# Patient Record
Sex: Male | Born: 1980 | Race: White | Hispanic: No | Marital: Married | Smoking: Current every day smoker
Health system: Southern US, Community
[De-identification: ages and names within clinical notes are randomized; demographics above are authoritative.]

## PROBLEM LIST (undated history)

## (undated) DIAGNOSIS — G4730 Sleep apnea, unspecified: Secondary | ICD-10-CM

## (undated) DIAGNOSIS — K219 Gastro-esophageal reflux disease without esophagitis: Secondary | ICD-10-CM

## (undated) DIAGNOSIS — F1011 Alcohol abuse, in remission: Secondary | ICD-10-CM

## (undated) DIAGNOSIS — F4310 Post-traumatic stress disorder, unspecified: Secondary | ICD-10-CM

## (undated) DIAGNOSIS — R030 Elevated blood-pressure reading, without diagnosis of hypertension: Secondary | ICD-10-CM

## (undated) DIAGNOSIS — Z9109 Other allergy status, other than to drugs and biological substances: Secondary | ICD-10-CM

## (undated) DIAGNOSIS — F419 Anxiety disorder, unspecified: Secondary | ICD-10-CM

## (undated) HISTORY — DX: Anxiety disorder, unspecified: F41.9

## (undated) HISTORY — DX: Alcohol abuse, in remission: F10.11

## (undated) HISTORY — DX: Sleep apnea, unspecified: G47.30

## (undated) HISTORY — PX: ELBOW SURGERY: SHX618

## (undated) HISTORY — DX: Other allergy status, other than to drugs and biological substances: Z91.09

## (undated) HISTORY — DX: Elevated blood-pressure reading, without diagnosis of hypertension: R03.0

## (undated) HISTORY — PX: FRACTURE SURGERY: SHX138

## (undated) HISTORY — DX: Post-traumatic stress disorder, unspecified: F43.10

## (undated) HISTORY — PX: ANKLE SURGERY: SHX546

---

## 1998-02-04 ENCOUNTER — Emergency Department (HOSPITAL_COMMUNITY): Admission: EM | Admit: 1998-02-04 | Discharge: 1998-02-04 | Payer: Self-pay | Admitting: Emergency Medicine

## 1998-02-08 ENCOUNTER — Encounter: Admission: RE | Admit: 1998-02-08 | Discharge: 1998-05-09 | Payer: Self-pay | Admitting: *Deleted

## 2003-03-08 ENCOUNTER — Emergency Department (HOSPITAL_COMMUNITY): Admission: EM | Admit: 2003-03-08 | Discharge: 2003-03-08 | Payer: Self-pay | Admitting: Emergency Medicine

## 2003-03-09 ENCOUNTER — Emergency Department (HOSPITAL_COMMUNITY): Admission: EM | Admit: 2003-03-09 | Discharge: 2003-03-09 | Payer: Self-pay | Admitting: Emergency Medicine

## 2006-10-11 ENCOUNTER — Emergency Department (HOSPITAL_COMMUNITY): Admission: EM | Admit: 2006-10-11 | Discharge: 2006-10-11 | Payer: Self-pay | Admitting: Emergency Medicine

## 2009-03-14 ENCOUNTER — Emergency Department (HOSPITAL_COMMUNITY): Admission: EM | Admit: 2009-03-14 | Discharge: 2009-03-14 | Payer: Self-pay | Admitting: Emergency Medicine

## 2013-05-26 ENCOUNTER — Emergency Department: Payer: Self-pay | Admitting: Emergency Medicine

## 2015-04-15 ENCOUNTER — Ambulatory Visit (INDEPENDENT_AMBULATORY_CARE_PROVIDER_SITE_OTHER): Payer: BLUE CROSS/BLUE SHIELD | Admitting: Physician Assistant

## 2015-04-15 VITALS — BP 114/78 | HR 92 | Temp 98.6°F | Resp 17 | Ht 69.0 in | Wt 244.0 lb

## 2015-04-15 DIAGNOSIS — H6593 Unspecified nonsuppurative otitis media, bilateral: Secondary | ICD-10-CM

## 2015-04-15 DIAGNOSIS — J0100 Acute maxillary sinusitis, unspecified: Secondary | ICD-10-CM | POA: Diagnosis not present

## 2015-04-15 MED ORDER — AMOXICILLIN-POT CLAVULANATE 875-125 MG PO TABS: 1.0000 | ORAL_TABLET | Freq: Two times a day (BID) | ORAL | Status: AC

## 2015-04-15 MED ORDER — IPRATROPIUM BROMIDE 0.03 % NA SOLN: 2.0000 | Freq: Two times a day (BID) | NASAL | Status: DC

## 2015-04-15 MED ORDER — GUAIFENESIN ER 1200 MG PO TB12: 1.0000 | ORAL_TABLET | Freq: Two times a day (BID) | ORAL | Status: DC | PRN

## 2015-04-15 NOTE — Progress Notes (Signed)
Subjective:   Patient ID: Terrence Terry, male     DOB: 10/09/1980, 35 y.o.    MRN: [id number]  PCP: No PCP Per Patient  Chief Complaint  Patient presents with  . Sinus Congestion    X 2 days  . Otalgia    Bilateral, X 2 days    HPI  Presents for evaluation of sinus congestion and b/l otatlgia x 2 days.   2.5 pack year history, current smoker. Reports sick contact with 37 month old daughter who has b/l ear infections. First noticed symptoms last Friday, 04/08/15, when nose started to become "stuffy". Over the last two days the congestion has worsened, and has been expereiencing b/l otolgia. Sinus pressure in maxilla region. Otalgia described as "pressure and achy". The ear pain last night started radiating down to his left jaw, and into his lymph nodes last night. Symptoms worsened when sitting still or trying to sleep. Associated rhinorrhea and PND. No relief with 2 tylenol pm on 2 occasion other than allowing him to fall asleep. Denies fever, chills, fatigue, dental problem, ear d/c, hearing loss, sore throat, or cough. .    Prior to Admission medications   Not on File     No Known Allergies   There are no active problems to display for this patient.    Family History  Problem Relation Age of Onset  . Cancer Father     Lung, Bone     Social History   Social History  . Marital Status: Single    Spouse Name: N/A  . Number of Children: N/A  . Years of Education: N/A   Occupational History  . Not on file.   Social History Main Topics  . Smoking status: Current Every Day Smoker  . Smokeless tobacco: Not on file  . Alcohol Use: 0.0 oz/week    0 Standard drinks or equivalent per week  . Drug Use: No  . Sexual Activity: Not on file   Other Topics Concern  . Not on file   Social History Narrative  . No narrative on file        Review of Systems Constitutional: Negative for fever, chills and fatigue.  HENT: Positive for congestion, postnasal drip,  rhinorrhea and sinus pressure. Negative for dental problem, ear discharge, ear pain, hearing loss, nosebleeds, sore throat and trouble swallowing.  Eyes: Negative for pain, itching and visual disturbance.  Respiratory: Negative for cough, chest tightness and shortness of breath.  Cardiovascular: Negative for chest pain.  Gastrointestinal: Negative for nausea and abdominal pain.  Musculoskeletal: Positive for myalgias and neck stiffness (attributes to throwing boxes at work).       Objective:  Physical Exam  Constitutional: [name] oriented to person, place, and time. Vital signs are normal. He appears well-developed and well-nourished. No distress.  BP 114/78 mmHg  Pulse 92  Temp(Src) 98.6 F (37 C) (Oral)  Resp 17  Ht  (1.753 m)  Wt 244 lb (110.678 kg)  BMI 36.02 kg/m2  SpO2 98%   HENT:  Head: Normocephalic and atraumatic.  Right Ear: Hearing, external ear and ear canal normal. No drainage or tenderness. Tympanic membrane is retracted. Tympanic membrane is not injected, not perforated, not erythematous and not bulging. A middle ear effusion is present. No hemotympanum.  Left Ear: Hearing, external ear and ear canal normal. No drainage or tenderness. Tympanic membrane is retracted. Tympanic membrane is not injected, not scarred, not perforated and not erythematous. A middle ear effusion is present.  No hemotympanum.  Nose: Mucosal edema and rhinorrhea present.  No foreign bodies. Right sinus exhibits no maxillary sinus tenderness and no frontal sinus tenderness. Left sinus exhibits no maxillary sinus tenderness and no frontal sinus tenderness.  Mouth/Throat: Uvula is midline, oropharynx is clear and moist and mucous membranes are normal. No uvula swelling. No oropharyngeal exudate.  Eyes: Conjunctivae and EOM are normal. Pupils are equal, round, and reactive to light. Right eye exhibits no discharge. Left eye exhibits no discharge. No scleral icterus.  Neck: Trachea normal, normal  range of motion and full passive range of motion without pain. Neck supple. No thyroid mass and no thyromegaly present.  Cardiovascular: Normal rate, regular rhythm and normal heart sounds.   Pulmonary/Chest: Effort normal and breath sounds normal.  Lymphadenopathy:       Head (right side): No submandibular, no tonsillar, no preauricular, no posterior auricular and no occipital adenopathy present.       Head (left side): No submandibular, no tonsillar, no preauricular and no occipital adenopathy present.    He has no cervical adenopathy.       Right: No supraclavicular adenopathy present.       Left: No supraclavicular adenopathy present.  Neurological: [name] alert and oriented to person, place, and time. He has normal strength. No cranial nerve deficit or sensory deficit.  Skin: Skin is warm, dry and intact. No rash noted.  Psychiatric: He has a normal mood and affect. His speech is normal and behavior is normal.             Assessment & Plan:  1. Acute maxillary sinusitis, recurrence not specified 2. Otitis media with effusion, bilateral High risk of developing AOM. Supportive care. Anticipatory guidance. Encouraged smoking cessation. - ipratropium (ATROVENT) 0.03 % nasal spray; Place 2 sprays into both nostrils 2 (two) times daily.  Dispense: 30 mL; Refill: 0 - Guaifenesin (MUCINEX MAXIMUM STRENGTH) 1200 MG TB12; Take 1 tablet (1,200 mg total) by mouth every 12 (twelve) hours as needed.  Dispense: 14 tablet; Refill: 1 - amoxicillin-clavulanate (AUGMENTIN) 875-125 MG tablet; Take 1 tablet by mouth 2 (two) times daily.  Dispense: 20 tablet; Refill: 0    Fernande Bras, PA-C Physician Assistant-Certified [facility]

## 2015-04-15 NOTE — Progress Notes (Signed)
Subjective:    Patient ID: Terrence Terry, male    DOB: Sep 12, 1980, 35 y.o.   MRN: [id number]  Chief Complaint  Patient presents with  . Sinus Congestion    X 2 days  . Otalgia    Bilateral, X 2 days    HPI Presents today for sinus congestion and b/l otatlgia x 2 days. 2.5 pack year history, current smoker. Reports sick contact with 74 month old daughter who has b/l ear infections. First noticed symptoms last Friday, 04/08/15, when nose started to become "stuffy". Over the last two days the congestion has worsened, and has been expereiencing b/l otolgia. Sinus pressure in maxilla region. Otalgia described as "pressure and achy". The ear pain last night started radiating down to his left jaw, and into his lymph nodes last night. Symptoms worsened when sitting still or trying to sleep. Associated rhinorrhea and PND. No relief with 2 tylenol pm on 2 occasion other than allowing him to fall asleep. Denies fever, chills, fatigue, dental problem, ear d/c, hearing loss, sore throat, or cough.    Review of Systems  Constitutional: Negative for fever, chills and fatigue.  HENT: Positive for congestion, postnasal drip, rhinorrhea and sinus pressure. Negative for dental problem, ear discharge, ear pain, hearing loss, nosebleeds, sore throat and trouble swallowing.   Eyes: Negative for pain, itching and visual disturbance.  Respiratory: Negative for cough, chest tightness and shortness of breath.   Cardiovascular: Negative for chest pain.  Gastrointestinal: Negative for nausea and abdominal pain.  Musculoskeletal: Positive for myalgias and neck stiffness (attributes to throwing boxes at work).   No Known Allergies  No current home medications  There are no active problems to display for this patient.     Objective:   Physical Exam  Constitutional: Vital signs are normal. He appears well-developed and well-nourished. No distress.  BP 114/78 mmHg  Pulse 92  Temp(Src) 98.6 F (37 C) (Oral)   Resp 17  Ht  (1.753 m)  Wt 244 lb (110.678 kg)  BMI 36.02 kg/m2  SpO2 98%   HENT:  Head: Normocephalic and atraumatic.  Right Ear: Hearing normal. No lacerations. There is swelling. No drainage or tenderness. No foreign bodies. No mastoid tenderness. Tympanic membrane is retracted. Tympanic membrane is not injected, not scarred, not perforated, not erythematous and not bulging. Tympanic membrane mobility is normal. A middle ear effusion is present. No decreased hearing is noted.  Left Ear: Hearing normal. No lacerations. There is swelling. No drainage or tenderness. No foreign bodies. No mastoid tenderness. Tympanic membrane is retracted. Tympanic membrane is not injected, not scarred, not perforated, not erythematous and not bulging. Tympanic membrane mobility is normal. A middle ear effusion is present. No hemotympanum. No decreased hearing is noted.  Nose: Rhinorrhea present. Right sinus exhibits maxillary sinus tenderness. Right sinus exhibits no frontal sinus tenderness. Left sinus exhibits maxillary sinus tenderness. Left sinus exhibits no frontal sinus tenderness.  Mouth/Throat: Uvula is midline and oropharynx is clear and moist. No oropharyngeal exudate.     Eyes: Conjunctivae, EOM and lids are normal. Pupils are equal, round, and reactive to light.  Neck: Trachea normal. Neck supple.  Cardiovascular: Normal rate, regular rhythm and normal heart sounds.  Exam reveals no gallop and no friction rub.   No murmur heard. Pulmonary/Chest: Effort normal and breath sounds normal. He has no wheezes.  Musculoskeletal:       Cervical back: He exhibits tenderness. He exhibits normal range of motion.  Lymphadenopathy:  Head (right side): No submental, no submandibular, no tonsillar, no preauricular, no posterior auricular and no occipital adenopathy present.       Head (left side): Submental and submandibular adenopathy present. No tonsillar, no preauricular, no posterior auricular and no  occipital adenopathy present.    He has no cervical adenopathy.  Neurological: [name] alert.  Skin: Skin is warm and dry. No rash noted.  Psychiatric: He has a normal mood and affect. His behavior is normal.  Vitals reviewed.     Assessment & Plan:  1. Acute maxillary sinusitis, recurrence not specified 2. Otitis media with effusion, bilateral - ipratropium (ATROVENT) 0.03 % nasal spray; Place 2 sprays into both nostrils 2 (two) times daily.  Dispense: 30 mL; Refill: 0 - Guaifenesin (MUCINEX MAXIMUM STRENGTH) 1200 MG TB12; Take 1 tablet (1,200 mg total) by mouth every 12 (twelve) hours as needed.  Dispense: 14 tablet; Refill: 1 - amoxicillin-clavulanate (AUGMENTIN) 875-125 MG tablet; Take 1 tablet by mouth 2 (two) times daily.  Dispense: 20 tablet; Refill: 0  -Patient instructed to drink plenty of fluids.   Return if symptoms worsen or fail to improve.

## 2015-04-15 NOTE — Patient Instructions (Signed)
Get plenty of rest and drink at least 64 ounces of water daily. 

## 2015-12-16 ENCOUNTER — Ambulatory Visit (INDEPENDENT_AMBULATORY_CARE_PROVIDER_SITE_OTHER): Payer: BLUE CROSS/BLUE SHIELD | Admitting: Family Medicine

## 2015-12-16 ENCOUNTER — Ambulatory Visit (INDEPENDENT_AMBULATORY_CARE_PROVIDER_SITE_OTHER): Payer: BLUE CROSS/BLUE SHIELD

## 2015-12-16 ENCOUNTER — Encounter: Payer: Self-pay | Admitting: Family Medicine

## 2015-12-16 VITALS — BP 122/80 | HR 90 | Temp 98.7°F | Resp 18 | Ht 69.0 in | Wt 257.0 lb

## 2015-12-16 DIAGNOSIS — M25522 Pain in left elbow: Secondary | ICD-10-CM

## 2015-12-16 DIAGNOSIS — Z23 Encounter for immunization: Secondary | ICD-10-CM | POA: Diagnosis not present

## 2015-12-16 MED ORDER — NAPROXEN 500 MG PO TABS: 500.0000 mg | ORAL_TABLET | Freq: Two times a day (BID) | ORAL | 0 refills | Status: DC

## 2015-12-16 NOTE — Patient Instructions (Addendum)
Start naproxen 500 mg twice daily with food for a minimal of 14 days. May continue longer if needed. Referral to orthopedic and they will contact you to schedule your appointment.   IF you received an x-ray today, you will [facility]. Please contact [facility] at [phone] with questions or concerns regarding your invoice.   IF you received labwork today, you will receive an invoice from [facility]. Please contact [facility] at [phone] with questions or concerns regarding your invoice.   Our billing staff will not be able to assist you with questions regarding bills from these companies.  You will be contacted with the lab results as soon as they are available. The fastest way to get your results is to activate your My Chart account. Instructions are located on the last page of this paperwork. If you have not heard from us regarding the results in 2 weeks, please contact this office.     Lateral Epicondylitis With Rehab Lateral epicondylitis involves inflammation and pain around the outer portion of the elbow. The pain is caused by inflammation of the tendons in the forearm that bring back (extend) the wrist. Lateral epicondylitis is also called tennis elbow, because it is very common in tennis players. However, it may occur in any individual who extends the wrist repetitively. If lateral epicondylitis is left untreated, it may become a chronic problem. SYMPTOMS   Pain, tenderness, and inflammation on the outer (lateral) side of the elbow.  Pain or weakness with gripping activities.  Pain that increases with wrist-twisting motions (playing tennis, using a screwdriver, opening a door or a jar).  Pain with lifting objects, including a coffee cup. CAUSES  Lateral epicondylitis is caused by inflammation of the tendons that extend the wrist. Causes of injury may include:  Repetitive stress and strain on the muscles  and tendons that extend the wrist.  Sudden change in activity level or intensity.  Incorrect grip in racquet sports.  Incorrect grip size of racquet (often too large).  Incorrect hitting position or technique (usually backhand, leading with the elbow).  Using a racket that is too heavy. RISK INCREASES WITH:  Sports or occupations that require repetitive and/or strenuous forearm and wrist movements (tennis, squash, racquetball, carpentry).  Poor wrist and forearm strength and flexibility.  Failure to warm up properly before activity.  Resuming activity before healing, rehabilitation, and conditioning are complete. PREVENTION   Warm up and stretch properly before activity.  Maintain physical fitness:  Strength, flexibility, and endurance.  Cardiovascular fitness.  Wear and use properly fitted equipment.  Learn and use proper technique and have a coach correct improper technique.  Wear a tennis elbow (counterforce) brace. PROGNOSIS  The course of this condition depends on the degree of the injury. If treated properly, acute cases (symptoms lasting less than 4 weeks) are often resolved in 2 to 6 weeks. Chronic (longer lasting cases) often resolve in 3 to 6 months but may require physical therapy. RELATED COMPLICATIONS   Frequently recurring symptoms, resulting in a chronic problem. Properly treating the problem the first time decreases frequency of recurrence.  Chronic inflammation, scarring tendon degeneration, and partial tendon tear, requiring surgery.  Delayed healing or resolution of symptoms. TREATMENT  Treatment first involves the use of ice and medicine to reduce pain and inflammation. Strengthening and stretching exercises may help reduce discomfort if performed regularly. These exercises may be performed at home if the condition is an acute injury. Chronic cases may require  a referral to a physical therapist for evaluation and treatment. Your caregiver may advise a  corticosteroid injection to help reduce inflammation. Rarely, surgery is needed. MEDICATION  If pain medicine is needed, nonsteroidal anti-inflammatory medicines (aspirin and ibuprofen), or other minor pain relievers (acetaminophen), are often advised.  Do not take pain medicine for 7 days before surgery.  Prescription pain relievers may be given, if your caregiver thinks they are needed. Use only as directed and only as much as you need.  Corticosteroid injections may be recommended. These injections should be reserved only for the most severe cases, because they can only be given a certain number of times. HEAT AND COLD  Cold treatment (icing) should be applied for 10 to 15 minutes every 2 to 3 hours for inflammation and pain, and immediately after activity that aggravates your symptoms. Use ice packs or an ice massage.  Heat treatment may be used before performing stretching and strengthening activities prescribed by your caregiver, physical therapist, or athletic trainer. Use a heat pack or a warm water soak. SEEK MEDICAL CARE IF: Symptoms get worse or do not improve in 2 weeks, despite treatment. EXERCISES  RANGE OF MOTION (ROM) AND STRETCHING EXERCISES - Epicondylitis, Lateral (Tennis Elbow) These exercises may help you when beginning to rehabilitate your injury. Your symptoms may go away with or without further involvement from your physician, physical therapist, or athletic trainer. While completing these exercises, remember:   Restoring tissue flexibility helps normal motion to return to the joints. This allows healthier, less painful movement and activity.  An effective stretch should be held for at least 30 seconds.  A stretch should never be painful. You should only feel a gentle lengthening or release in the stretched tissue. RANGE OF MOTION - Wrist Flexion, Active-Assisted  Extend your right / left elbow with your fingers pointing down.*  Gently pull the back of your hand  towards you, until you feel a gentle stretch on the top of your forearm.  Hold this position for __________ seconds. Repeat __________ times. Complete this exercise __________ times per day.  *If directed by your physician, physical therapist or athletic trainer, complete this stretch with your elbow bent, rather than extended. RANGE OF MOTION - Wrist Extension, Active-Assisted  Extend your right / left elbow and turn your palm upwards.*  Gently pull your palm and fingertips back, so your wrist extends and your fingers point more toward the ground.  You should feel a gentle stretch on the inside of your forearm.  Hold this position for __________ seconds. Repeat __________ times. Complete this exercise __________ times per day. *If directed by your physician, physical therapist or athletic trainer, complete this stretch with your elbow bent, rather than extended. STRETCH - Wrist Flexion  Place the back of your right / left hand on a tabletop, leaving your elbow slightly bent. Your fingers should point away from your body.  Gently press the back of your hand down onto the table by straightening your elbow. You should feel a stretch on the top of your forearm.  Hold this position for __________ seconds. Repeat __________ times. Complete this stretch __________ times per day.  STRETCH - Wrist Extension   Place your right / left fingertips on a tabletop, leaving your elbow slightly bent. Your fingers should point backwards.  Gently press your fingers and palm down onto the table by straightening your elbow. You should feel a stretch on the inside of your forearm.  Hold this position for __________ seconds.  Repeat __________ times. Complete this stretch __________ times per day.  STRENGTHENING EXERCISES - Epicondylitis, Lateral (Tennis Elbow) These exercises may help you when beginning to rehabilitate your injury. They may resolve your symptoms with or without further involvement from  your physician, physical therapist, or athletic trainer. While completing these exercises, remember:   Muscles can gain both the endurance and the strength needed for everyday activities through controlled exercises.  Complete these exercises as instructed by your physician, physical therapist or athletic trainer. Increase the resistance and repetitions only as guided.  You may experience muscle soreness or fatigue, but the pain or discomfort you are trying to eliminate should never worsen during these exercises. If this pain does get worse, stop and make sure you are following the directions exactly. If the pain is still present after adjustments, discontinue the exercise until you can discuss the trouble with your caregiver. STRENGTH - Wrist Flexors  Sit with your right / left forearm palm-up and fully supported on a table or countertop. Your elbow should be resting below the height of your shoulder. Allow your wrist to extend over the edge of the surface.  Loosely holding a __________ weight, or a piece of rubber exercise band or tubing, slowly curl your hand up toward your forearm.  Hold this position for __________ seconds. Slowly lower the wrist back to the starting position in a controlled manner. Repeat __________ times. Complete this exercise __________ times per day.  STRENGTH - Wrist Extensors  Sit with your right / left forearm palm-down and fully supported on a table or countertop. Your elbow should be resting below the height of your shoulder. Allow your wrist to extend over the edge of the surface.  Loosely holding a __________ weight, or a piece of rubber exercise band or tubing, slowly curl your hand up toward your forearm.  Hold this position for __________ seconds. Slowly lower the wrist back to the starting position in a controlled manner. Repeat __________ times. Complete this exercise __________ times per day.  STRENGTH - Ulnar Deviators  Stand with a  ____________________ weight in your right / left hand, or sit while holding a rubber exercise band or tubing, with your healthy arm supported on a table or countertop.  Move your wrist, so that your pinkie travels toward your forearm and your thumb moves away from your forearm.  Hold this position for __________ seconds and then slowly lower the wrist back to the starting position. Repeat __________ times. Complete this exercise __________ times per day STRENGTH - Radial Deviators  Stand with a ____________________ weight in your right / left hand, or sit while holding a rubber exercise band or tubing, with your injured arm supported on a table or countertop.  Raise your hand upward in front of you or pull up on the rubber tubing.  Hold this position for __________ seconds and then slowly lower the wrist back to the starting position. Repeat __________ times. Complete this exercise __________ times per day. STRENGTH - Forearm Supinators   Sit with your right / left forearm supported on a table, keeping your elbow below shoulder height. Rest your hand over the edge, palm down.  Gently grip a hammer or a soup ladle.  Without moving your elbow, slowly turn your palm and hand upward to a "thumbs-up" position.  Hold this position for __________ seconds. Slowly return to the starting position. Repeat __________ times. Complete this exercise __________ times per day.  STRENGTH - Forearm Pronators   Sit with  your right / left forearm supported on a table, keeping your elbow below shoulder height. Rest your hand over the edge, palm up.  Gently grip a hammer or a soup ladle.  Without moving your elbow, slowly turn your palm and hand upward to a "thumbs-up" position.  Hold this position for __________ seconds. Slowly return to the starting position. Repeat __________ times. Complete this exercise __________ times per day.  STRENGTH - Grip  Grasp a tennis ball, a dense sponge, or a large,  rolled sock in your hand.  Squeeze as hard as you can, without increasing any pain.  Hold this position for __________ seconds. Release your grip slowly. Repeat __________ times. Complete this exercise __________ times per day.  STRENGTH - Elbow Extensors, Isometric  Stand or sit upright, on a firm surface. Place your right / left arm so that your palm faces your stomach, and it is at the height of your waist.  Place your opposite hand on the underside of your forearm. Gently push up as your right / left arm resists. Push as hard as you can with both arms, without causing any pain or movement at your right / left elbow. Hold this stationary position for __________ seconds. Gradually release the tension in both arms. Allow your muscles to relax completely before repeating.   This information is not intended to replace advice given to you by your health care provider. Make sure you discuss any questions you have with your health care provider.   Document Released: 03/05/2005 Document Revised: 03/26/2014 Document Reviewed: 06/17/2008 [employer] Interactive Patient Education [employer]

## 2015-12-16 NOTE — Progress Notes (Signed)
Patient ID: Terrence Terry, male    DOB: December 27, 1980, 35 y.o.   MRN: [id number]  PCP: No PCP Per Patient  Chief Complaint  Patient presents with  . Joint Swelling    left elbow pain  . Flu Vaccine    Subjective:   HPI 35 year old presents for evaluation of left elbow pain times 1.5 weeks. He reports that he works at [employer] and performs repetitive arm/elbow motions for 6-8 hours per day. He reports pain increases substantially during his work day.  He denies injury or trauma but reports a swollen spongy mass on the lateral elbow which has been present for 1 year. Patient has taken tylenol pm for pain with relief.  Social History   Social History  . Marital status: Single    Spouse name: N/A  . Number of children: N/A  . Years of education: N/A   Occupational History  . Not on file.   Social History Main Topics  . Smoking status: Current Every Day Smoker  . Smokeless tobacco: Never Used  . Alcohol use 0.0 oz/week  . Drug use: No  . Sexual activity: Not on file   Other Topics Concern  . Not on file   Social History Narrative  . No narrative on file   Family History  Problem Relation Age of Onset  . Cancer Father     Lung, Bone   Review of Systems See HPI  There are no active problems to display for this patient.  Prior to Admission medications   Not on File    No Known Allergies     Objective:  Physical Exam  Constitutional: [name] oriented to person, place, and time. He appears well-developed and well-nourished.  HENT:  Head: Normocephalic and atraumatic.  Right Ear: External ear normal.  Left Ear: External ear normal.  Nose: Nose normal.  Mouth/Throat: Oropharynx is clear and moist.  Eyes: Conjunctivae are normal. Pupils are equal, round, and reactive to light.  Neck: Normal range of motion.  Cardiovascular: Normal rate, regular rhythm, normal heart sounds and intact distal pulses.   Pulmonary/Chest: Effort normal and breath sounds  normal.  Musculoskeletal:       Arms: Left lateral elbow tenderness with hands gripping, flexion, and rotating motion movements.   Neurological: [name] alert and oriented to person, place, and time.  Skin: Skin is warm and dry.  Psychiatric: He has a normal mood and affect. His behavior is normal. Judgment and thought content normal.     Dg Elbow Complete Left (3+view)  Result Date: 12/16/2015 CLINICAL DATA:  Left elbow pain, possible effusion. EXAM: LEFT ELBOW - COMPLETE 3+ VIEW COMPARISON:  None. FINDINGS: Mild spurring of the coronoid process. No elbow joint effusion. No acute bony findings. Low-level subcutaneous edema dorsal to the olecranon. IMPRESSION: 1. No fracture or joint effusion identified. 2. Low-level subcutaneous edema posterior to the elbow. Electronically Signed   By: Gaylyn RongWalter  Liebkemann M.D.   On: 12/16/2015 08:51   Assessment & Plan:  1. Left elbow pain - DG ELBOW COMPLETE LEFT (3+VIEW); Future - AMB referral to orthopedics Plan:  Start naproxen 500 mg twice daily with food for minimum of 14 days. Longer if needed. Mild bone spurring present coronoid process, possibly related to arthritic changes.  Referring to orthopedics for further evaluation.  2. Need for prophylactic vaccination and inoculation against influenza - Flu Vaccine QUAD 36+ mos IM  Godfrey PickKimberly S. Tiburcio PeaHarris, MSN, FNP-C [facility]

## 2017-10-09 IMAGING — DX DG ELBOW COMPLETE 3+V*L*
4 series · 4 of 4 positions shown · non-contrast
Comparison: None.

CLINICAL DATA: Left elbow pain, possible effusion.

EXAM:
LEFT ELBOW - COMPLETE 3+ VIEW

[elbow ap]
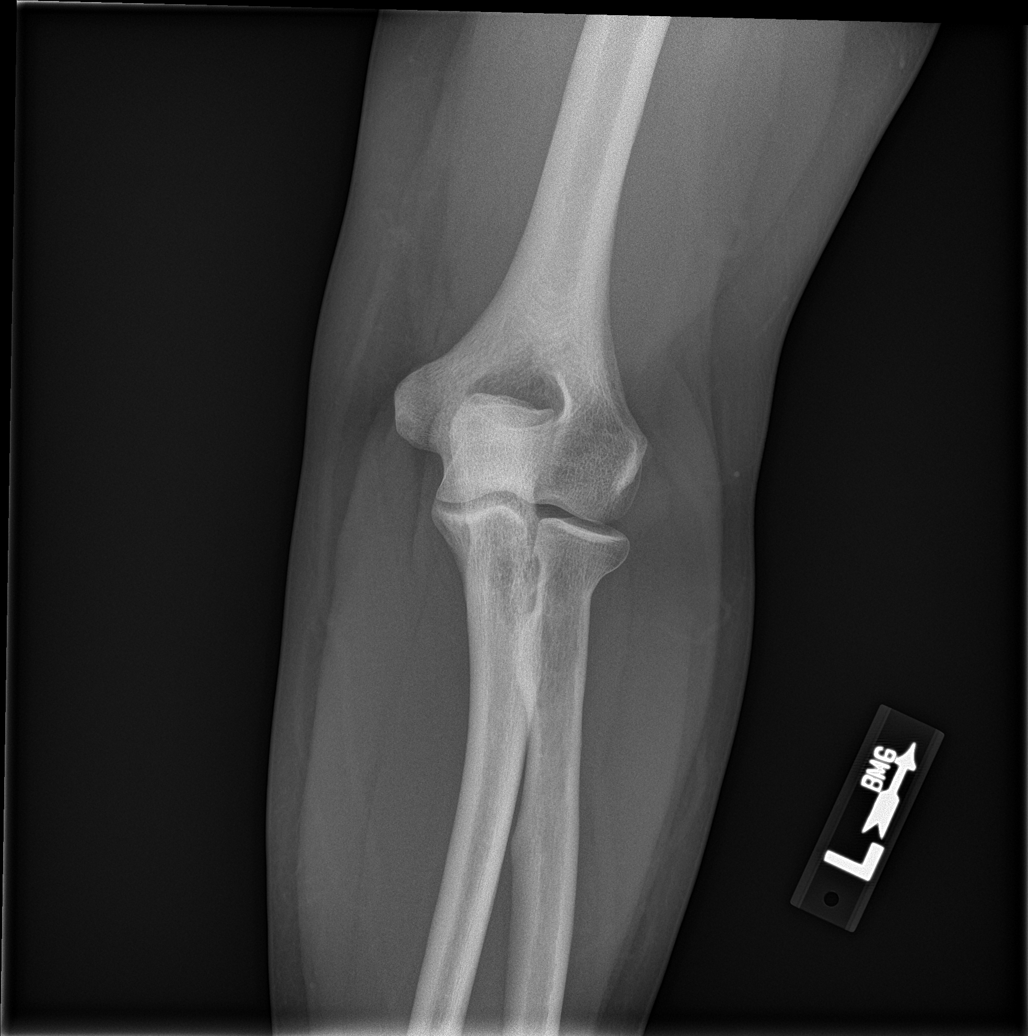

[elbow obl (1 of 2)]
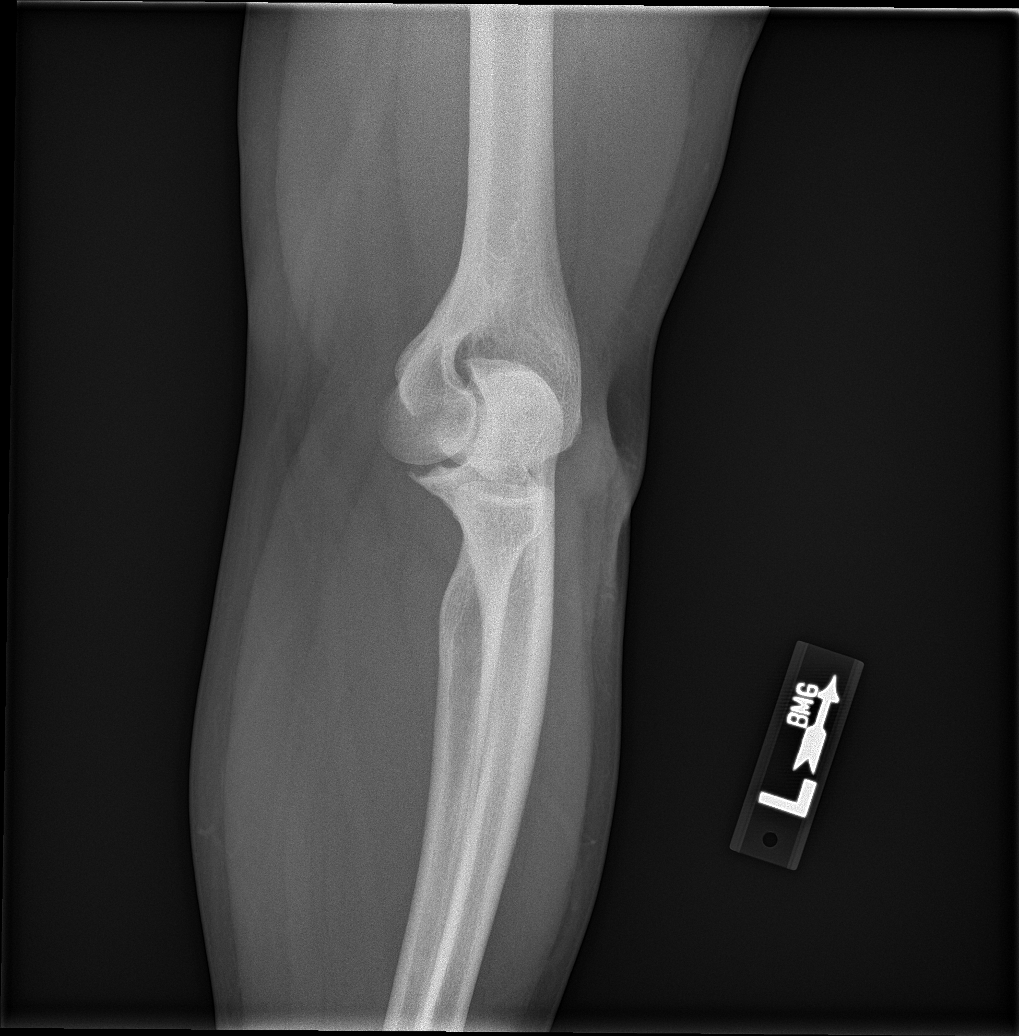

[elbow obl (2 of 2)]
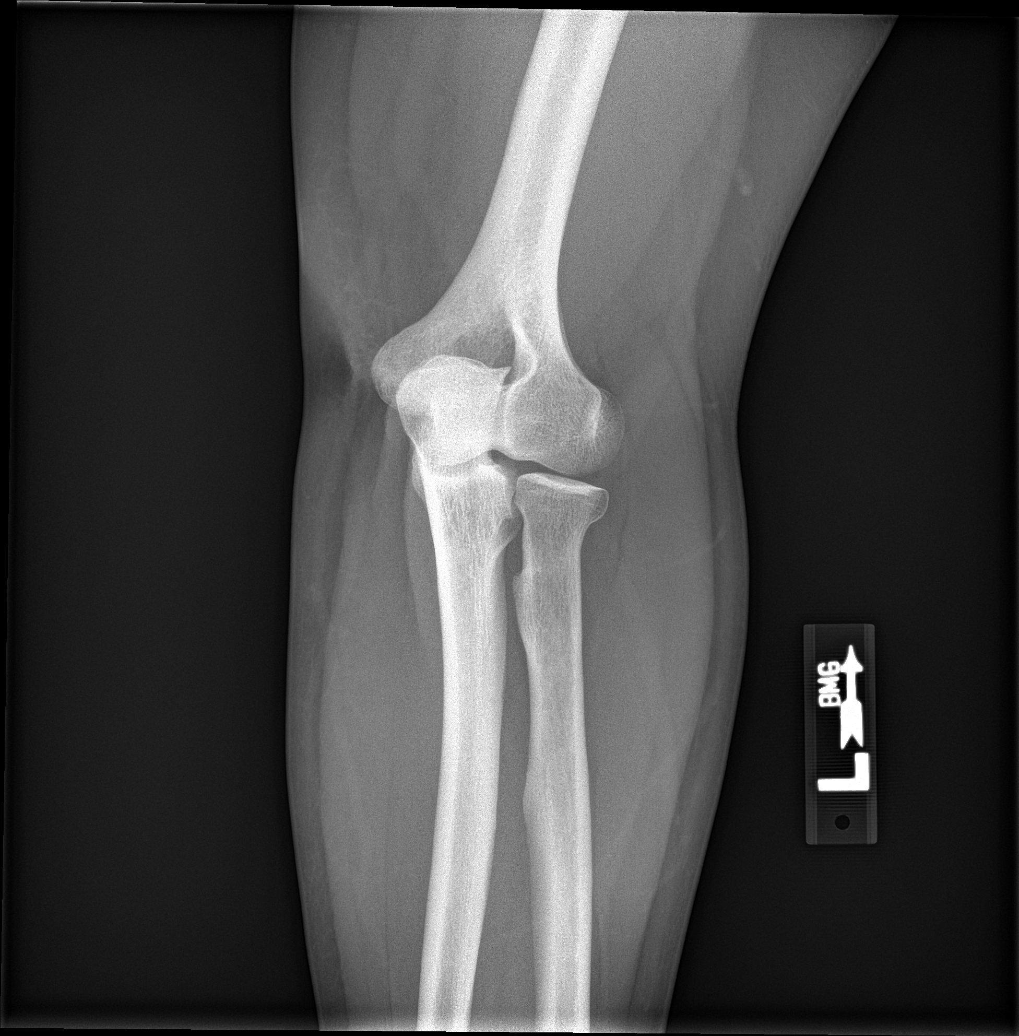

[elbow lat]
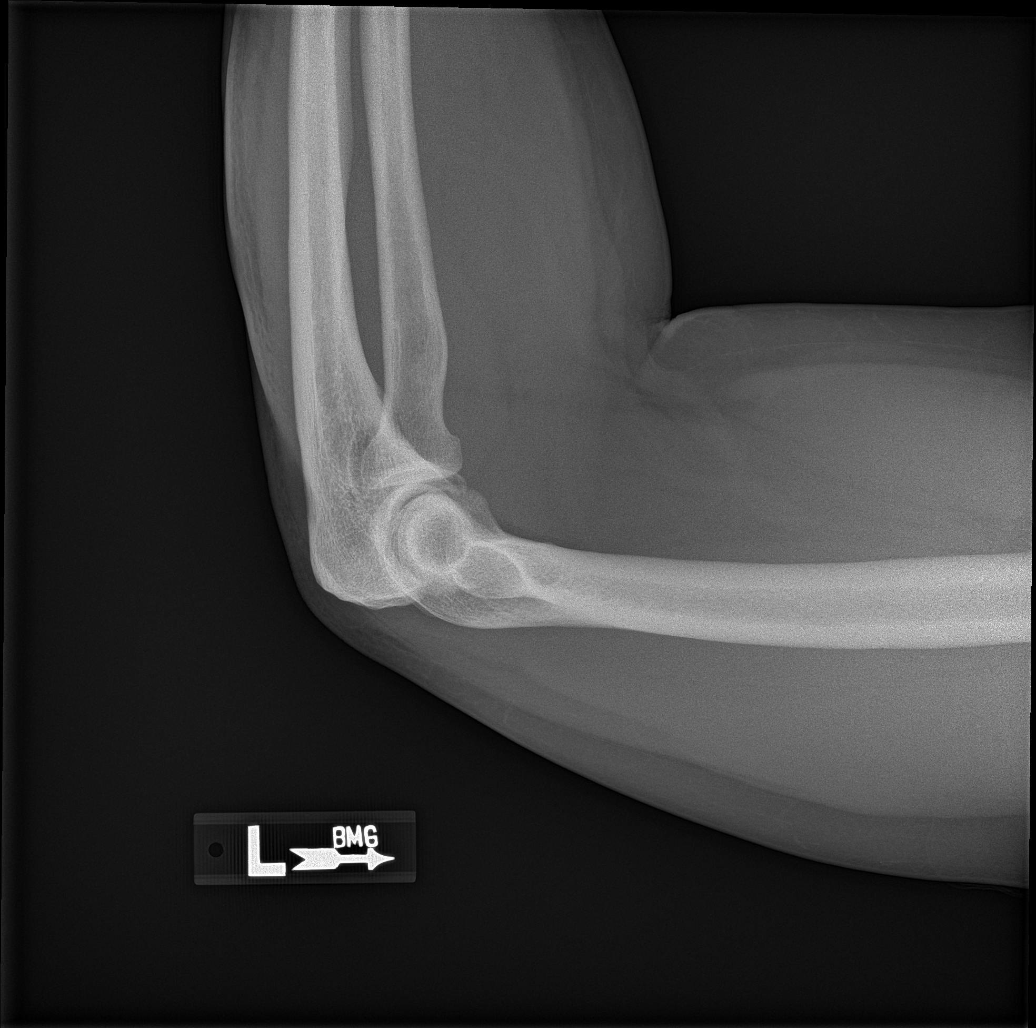

[4 of 4 positions shown; findings below may reference images not displayed]

FINDINGS: Mild spurring of the coronoid process. No elbow joint effusion. No
acute bony findings. Low-level subcutaneous edema dorsal to the
olecranon.
IMPRESSION: 1. No fracture or joint effusion identified.
2. Low-level subcutaneous edema posterior to the elbow.

## 2017-11-25 DIAGNOSIS — R059 Cough, unspecified: Secondary | ICD-10-CM | POA: Insufficient documentation

## 2017-11-25 DIAGNOSIS — Z9109 Other allergy status, other than to drugs and biological substances: Secondary | ICD-10-CM | POA: Insufficient documentation

## 2018-05-02 DIAGNOSIS — G4730 Sleep apnea, unspecified: Secondary | ICD-10-CM | POA: Insufficient documentation

## 2018-07-12 ENCOUNTER — Telehealth: Payer: Self-pay | Admitting: Nurse Practitioner

## 2018-07-12 DIAGNOSIS — J069 Acute upper respiratory infection, unspecified: Secondary | ICD-10-CM

## 2018-07-12 MED ORDER — BENZONATATE 100 MG PO CAPS: 100.0000 mg | ORAL_CAPSULE | Freq: Three times a day (TID) | ORAL | 0 refills | Status: DC | PRN

## 2018-07-12 NOTE — Progress Notes (Signed)
E-Visit for Corona Virus Screening  Based on your current symptoms, it seems unlikely that your symptoms are related to the Coronavirus.   Coronavirus disease 2019 (COVID-19) is a respiratory illness that can spread from person to person. The virus that causes COVID-19 is a new virus that was first identified in the country of [place] but is now found in multiple other countries and has spread to the [place].  Symptoms associated with the virus are mild to severe fever, cough, and shortness of breath. There is currently no vaccine to protect against COVID-19, and there is no specific antiviral treatment for the virus.   To be considered HIGH RISK for Coronavirus (COVID-19), you have to meet the following criteria:  . Traveled to [place], [place], [place], [place] or [place]; or in the [place] to [place], [place], [place], or [place]; and have fever, cough, and shortness of breath within the last 2 weeks of travel OR  . Been in close contact with a person diagnosed with COVID-19 within the last 2 weeks and have fever, cough, and shortness of breath  . IF YOU DO NOT MEET THESE CRITERIA, YOU ARE CONSIDERED LOW RISK FOR COVID-19.   It is vitally important that if you feel that you have an infection such as this virus or any other virus that you stay home and away from places where you may spread it to others.  You should self-quarantine for 14 days if you have symptoms that could potentially be coronavirus and avoid contact with people age 65 and older.   You can use medication such as A prescription cough medication called Tessalon Perles 100 mg. You may take 1-2 capsules every 8 hours as needed for cough  You may also take acetaminophen (Tylenol) as needed for fever.   Reduce your risk of any infection by using the same precautions used for avoiding the common cold or flu:  . Wash your hands often with soap and warm water for at least 20 seconds.  If soap and water are not readily  available, use an alcohol-based hand sanitizer with at least 60% alcohol.  . If coughing or sneezing, cover your mouth and nose by coughing or sneezing into the elbow areas of your shirt or coat, into a tissue or into your sleeve (not your hands). . Avoid shaking hands with others and consider head nods or verbal greetings only. . Avoid touching your eyes, nose, or mouth with unwashed hands.  . Avoid close contact with people who are sick. . Avoid places or events with large numbers of people in one location, like concerts or sporting events. . Carefully consider travel plans you have or are making. . If you are planning any travel outside or inside the [place], visit the CDC's Travelers' Health webpage for the latest health notices. . If you have some symptoms but not all symptoms, continue to monitor at home and seek medical attention if your symptoms worsen. . If you are having a medical emergency, call 911.  HOME CARE . Only take medications as instructed by your medical team. . Drink plenty of fluids and get plenty of rest. . A steam or ultrasonic humidifier can help if you have congestion.   GET HELP RIGHT AWAY IF: . You develop worsening fever. . You become short of breath . You cough up blood. . Your symptoms become more severe MAKE SURE YOU   Understand these instructions.  Will watch your condition.  Will get help   right away if you are not doing well or get worse.  Your e-visit answers were reviewed by a board certified advanced clinical practitioner to complete your personal care plan.  Depending on the condition, your plan could have included both over the counter or prescription medications.  If there is a problem please reply once you have received a response from your provider. Your safety is important to us.  If you have drug allergies check your prescription carefully.    You can use MyChart to ask questions about today's visit, request a non-urgent call back, or ask for a  work or school excuse for 24 hours related to this e-Visit. If it has been greater than 24 hours you will need to follow up with your provider, or enter a new e-Visit to address those concerns. You will get an e-mail in the next two days asking about your experience.  I hope that your e-visit has been valuable and will speed your recovery. Thank you for using e-visits.  5-10 minutes spent reviewing and documenting in chart.   

## 2018-07-14 ENCOUNTER — Other Ambulatory Visit: Payer: Self-pay

## 2018-07-14 ENCOUNTER — Encounter: Payer: Self-pay | Admitting: Emergency Medicine

## 2018-07-14 ENCOUNTER — Ambulatory Visit
Admission: EM | Admit: 2018-07-14 | Discharge: 2018-07-14 | Disposition: A | Discharge status: Home / Self Care | Payer: BLUE CROSS/BLUE SHIELD

## 2018-07-14 DIAGNOSIS — R05 Cough: Secondary | ICD-10-CM

## 2018-07-14 DIAGNOSIS — R059 Cough, unspecified: Secondary | ICD-10-CM

## 2018-07-14 DIAGNOSIS — F17200 Nicotine dependence, unspecified, uncomplicated: Secondary | ICD-10-CM

## 2018-07-14 NOTE — ED Notes (Signed)
Patient able to ambulate independently  

## 2018-07-14 NOTE — ED Triage Notes (Signed)
Pt presents to [facility] for assessment of cough, fever, chest tightness and mild SOB.  States he noted blood in his mucous Friday, and again this morning.

## 2018-07-14 NOTE — ED Provider Notes (Signed)
[facility]    CSN: [id number] Arrival date & time: 07/14/18  1031     History   Chief Complaint Chief Complaint  Patient presents with  . Cough    HPI Terrence Terry is a 38 y.o. male.   Patient is a 38 year old male presents today with cough, fever, chest tightness, mild shortness of breath x4 days.  He has had some mild hemoptysis.  His symptoms have since improved.  [name] no longer having fever.  He has been taking Tessalon Perles for cough with improvement.  He still coughing up mucus, blood-tinged.  [name] a current everyday smoker.  He reports that his kids have been sick with cough.  He works at [employer] and they have had a few sick contacts at work.  Possible COVID exposure.  ROS per HPI      Past Medical History:  Diagnosis Date  . Sleep apnea     There are no active problems to display for this patient.   History reviewed. No pertinent surgical history.     Home Medications    Prior to Admission medications   Medication Sig Start Date End Date Taking? Authorizing Provider  benzonatate (TESSALON PERLES) 100 MG capsule Take 1 capsule (100 mg total) by mouth 3 (three) times daily as needed. 07/12/18   Daphine DeutscherMartin, Mary-Margaret, FNP  naproxen (NAPROSYN) 500 MG tablet Take 1 tablet (500 mg total) by mouth 2 (two) times daily with a meal. 12/16/15   Bing NeighborsHarris, Kimberly S, FNP    Family History Family History  Problem Relation Age of Onset  . Cancer Father        Lung, Bone    Social History Social History   Tobacco Use  . Smoking status: Current Every Day Smoker    Packs/day: 0.25  . Smokeless tobacco: Never Used  Substance Use Topics  . Alcohol use: Yes    Alcohol/week: 0.0 standard drinks  . Drug use: No     Allergies   Patient has no known allergies.   Review of Systems Review of Systems   Physical Exam Triage Vital Signs ED Triage Vitals  Enc Vitals Group     BP 07/14/18 1041 (!) 137/93     Pulse Rate 07/14/18 1041 96     Resp  --      Temp 07/14/18 1041 98.5 F (36.9 C)     Temp src --      SpO2 07/14/18 1041 98 %     Weight --      Height --      Head Circumference --      Peak Flow --      Pain Score 07/14/18 1042 4     Pain Loc --      Pain Edu? --      Excl. in GC? --    No data found.  Updated Vital Signs BP (!) 137/93 (BP Location: Right Arm)   Pulse 96   Temp 98.5 F (36.9 C) Comment: no meds today  SpO2 98%   Visual Acuity Right Eye Distance:   Left Eye Distance:   Bilateral Distance:    Right Eye Near:   Left Eye Near:    Bilateral Near:     Physical Exam Vitals signs and nursing note reviewed.  Constitutional:      General: [name] not in acute distress.    Appearance: Normal appearance. [name] not ill-appearing or toxic-appearing.  HENT:     Head: Normocephalic  and atraumatic.     Right Ear: Tympanic membrane and ear canal normal.     Left Ear: Tympanic membrane and ear canal normal.     Nose: Nose normal.     Mouth/Throat:     Pharynx: Oropharynx is clear.  Eyes:     Conjunctiva/sclera: Conjunctivae normal.  Neck:     Musculoskeletal: Normal range of motion.  Cardiovascular:     Rate and Rhythm: Normal rate and regular rhythm.  Pulmonary:     Effort: Pulmonary effort is normal.     Breath sounds: Normal breath sounds.  Musculoskeletal: Normal range of motion.  Skin:    General: Skin is warm and dry.  Neurological:     Mental Status: [name] alert.  Psychiatric:        Mood and Affect: Mood normal.      UC Treatments / Results  Labs (all labs ordered are listed, but only abnormal results are displayed) Labs Reviewed - No data to display  EKG None  Radiology No results found.  Procedures Procedures (including critical care time)  Medications Ordered in UC Medications - No data to display  Initial Impression / Assessment and Plan / UC Course  I have reviewed the triage vital signs and the nursing notes.  Pertinent labs & imaging results that were  available during my care of the patient were reviewed by me and considered in my medical decision making (see chart for details).     Cough   Patient appears well on exam. Lungs clear, vital signs stable and he is nontoxic or ill-appearing. His symptoms have improved since Friday and he has been taking Lawyer with relief Based on COVID exposure I feel will be appropriate to have him take some time off work and return next week just for precaution Patient understanding and agree Follow up as needed for continued or worsening symptoms  Final Clinical Impressions(s) / UC Diagnoses   Final diagnoses:  Cough     Discharge Instructions     I believe this cough is related to some sort of virus. Whether it is the common cold virus or COVID We do not test for COVID so we just treat this as COVID With that being said I will write you off for work for the next week If you stay fever free and your symptoms improve you will be able to return back to work I sure you are doing your special distancing  and hand hygiene Take the tessalon pearls as needed  Follow up as needed for continued or worsening symptoms     ED Prescriptions    None     Controlled Substance Prescriptions [place] Controlled Substance Registry consulted? Not Applicable   Terrence Aris, NP 07/14/18 1238

## 2018-07-14 NOTE — Discharge Instructions (Addendum)
I believe this cough is related to some sort of virus. Whether it is the common cold virus or COVID We do not test for COVID so we just treat this as COVID With that being said I will write you off for work for the next week If you stay fever free and your symptoms improve you will be able to return back to work I sure you are doing your special distancing  and hand hygiene Take the tessalon pearls as needed  Follow up as needed for continued or worsening symptoms

## 2018-12-31 ENCOUNTER — Ambulatory Visit
Admission: EM | Admit: 2018-12-31 | Discharge: 2018-12-31 | Disposition: A | Discharge status: Home / Self Care | Payer: BC Managed Care – PPO | Attending: Emergency Medicine | Admitting: Emergency Medicine

## 2018-12-31 ENCOUNTER — Encounter: Payer: Self-pay | Admitting: Emergency Medicine

## 2018-12-31 ENCOUNTER — Other Ambulatory Visit: Payer: Self-pay

## 2018-12-31 DIAGNOSIS — F17200 Nicotine dependence, unspecified, uncomplicated: Secondary | ICD-10-CM

## 2018-12-31 DIAGNOSIS — R0789 Other chest pain: Secondary | ICD-10-CM | POA: Diagnosis not present

## 2018-12-31 MED ORDER — NAPROXEN 500 MG PO TABS: 500.0000 mg | ORAL_TABLET | Freq: Two times a day (BID) | ORAL | 0 refills | Status: DC

## 2018-12-31 NOTE — ED Provider Notes (Signed)
[facility]    CSN: [id number] Arrival date & time: 12/31/18  0807      History   Chief Complaint Chief Complaint  Patient presents with  . Chest Pain    HPI Terrence Terry is a 38 y.o. male with history of tobacco use, sleep apnea presenting for 3-week course of intermittent, nonexertional left-sided chest pain.  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  Patient is also had intermittent left arm numbness without known exacerbating/alleviating factors.  Patient states sometimes he wakes up in the middle the night with this.  Patient states he mostly sleeps on his right side, though does move around a lot.  Patient does do circular left arm motion with his left arm/shoulder and states "I do this over an overall day".  Patient denies cough, shortness of breath, fever.  Has not had routine care "in a while now ".  No personal family history of stroke/MI.  No family history of young, sudden death.  Patient denies heavy alcohol use, illicit drug use.   Past Medical History:  Diagnosis Date  . Sleep apnea     There are no active problems to display for this patient.   Past Surgical History:  Procedure Laterality Date  . ANKLE SURGERY Right   . ELBOW SURGERY         Home Medications    Prior to Admission medications   Medication Sig Start Date End Date Taking? Authorizing Provider  naproxen (NAPROSYN) 500 MG tablet Take 1 tablet (500 mg total) by mouth 2 (two) times daily with a meal. 12/31/18   Hall-Potvin, [place], PA-C    Family History Family History  Problem Relation Age of Onset  . Cancer Father        Lung, Bone    Social History Social History   Tobacco Use  . Smoking status: Current Every Day Smoker    Packs/day: 0.25  . Smokeless tobacco: Never Used  Substance Use Topics  . Alcohol use: Yes    Alcohol/week: 0.0 standard drinks  . Drug use: No     Allergies   Patient has no known  allergies.   Review of Systems Review of Systems  Constitutional: Negative for fatigue and fever.  Respiratory: Negative for cough and shortness of breath.   Cardiovascular: Positive for chest pain. Negative for palpitations and leg swelling.  Gastrointestinal: Negative for abdominal pain, diarrhea and vomiting.  Musculoskeletal: Negative for arthralgias and myalgias.  Skin: Negative for rash and wound.  Neurological: Negative for speech difficulty and headaches.  All other systems reviewed and are negative.    Physical Exam Triage Vital Signs ED Triage Vitals  Enc Vitals Group     BP      Pulse      Resp      Temp      Temp src      SpO2      Weight      Height      Head Circumference      Peak Flow      Pain Score      Pain Loc      Pain Edu?      Excl. in GC?    No data found.  Updated Vital Signs BP 122/83 (BP Location: Left Arm)   Pulse 76   Temp 97.9 F (36.6 C) (Temporal)   Resp 18   SpO2 97%    Physical Exam Constitutional:  General: [name] not in acute distress. HENT:     Head: Normocephalic and atraumatic.  Eyes:     General: No scleral icterus.    Pupils: Pupils are equal, round, and reactive to light.  Cardiovascular:     Rate and Rhythm: Normal rate and regular rhythm.  No extrasystoles are present.    Chest Wall: PMI is not displaced.     Heart sounds: No murmur. No gallop.   Pulmonary:     Effort: Pulmonary effort is normal. No tachypnea, accessory muscle usage or respiratory distress.     Breath sounds: Normal breath sounds. No wheezing.  Musculoskeletal:     Comments: Full active ROM of shoulders bilaterally.  Left shoulder with mild AC joint tenderness, no crepitus.  Strength 5/5, neurovascularly intact bilaterally  Skin:    Coloration: Skin is not jaundiced or pale.  Neurological:     Mental Status: [name] alert and oriented to person, place, and time.      UC Treatments / Results  Labs (all labs ordered are listed, but  only abnormal results are displayed) Labs Reviewed - No data to display  EKG   Radiology No results found.  Procedures Procedures (including critical care time)  Medications Ordered in UC Medications - No data to display  Initial Impression / Assessment and Plan / UC Course  I have reviewed the triage vital signs and the nursing notes.  Pertinent labs & imaging results that were available during my care of the patient were reviewed by me and considered in my medical decision making (see chart for details).     EKG done in office, reviewed by me without previous to compare: Normal sinus rhythm with sinus arrhythmia.  Ventricular rate 68 bpm without QTC prolongation, ST elevation.  Reviewed results, provided reassurance to patient satisfaction.  Encourage patient to ice shoulder, areas of pain after work, take NSAID as outlined below, do stretches for additional relief.  Discussed importance of follow-up with PCP for further re-stratification of cardiac complications.  ER return precautions discussed, patient verbalized understanding and is agreeable to plan. Final Clinical Impressions(s) / UC Diagnoses   Final diagnoses:  Other chest pain     Discharge Instructions     Important to follow-up with primary care for routine physical. Ice during breaks at work, when you get home from work. Take naproxen once daily as needed pain. Go to ER for further evaluation if you develop severe chest tightness/pressure/pain, difficulty breathing, nausea, lightheadedness, loss of consciousness.    ED Prescriptions    Medication Sig Dispense Auth. Provider   naproxen (NAPROSYN) 500 MG tablet Take 1 tablet (500 mg total) by mouth 2 (two) times daily with a meal. 30 tablet Hall-Potvin, [place], PA-C     PDMP not reviewed this encounter.   Hall-Potvin, [place], [place] 12/31/18 1732

## 2018-12-31 NOTE — Discharge Instructions (Signed)
Important to follow-up with primary care for routine physical. Ice during breaks at work, when you get home from work. Take naproxen once daily as needed pain. Go to ER for further evaluation if you develop severe chest tightness/pressure/pain, difficulty breathing, nausea, lightheadedness, loss of consciousness.

## 2018-12-31 NOTE — ED Notes (Signed)
Patient able to ambulate independently  
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
## 2019-01-01 ENCOUNTER — Telehealth: Payer: Self-pay

## 2019-01-15 ENCOUNTER — Ambulatory Visit
Admission: EM | Admit: 2019-01-15 | Discharge: 2019-01-15 | Disposition: A | Discharge status: Home / Self Care | Payer: BC Managed Care – PPO | Attending: Emergency Medicine | Admitting: Emergency Medicine

## 2019-01-15 ENCOUNTER — Encounter: Payer: Self-pay | Admitting: Emergency Medicine

## 2019-01-15 ENCOUNTER — Other Ambulatory Visit: Payer: Self-pay

## 2019-01-15 DIAGNOSIS — M778 Other enthesopathies, not elsewhere classified: Secondary | ICD-10-CM | POA: Diagnosis not present

## 2019-01-15 MED ORDER — NAPROXEN 500 MG PO TABS: 500.0000 mg | ORAL_TABLET | Freq: Two times a day (BID) | ORAL | 0 refills | Status: DC | PRN

## 2019-01-15 NOTE — ED Provider Notes (Signed)
[facility]    CSN: [id number] Arrival date & time: 01/15/19  0840      History   Chief Complaint Chief Complaint  Patient presents with  . Elbow Pain    HPI Terrence Terry is a 38 y.o. male presenting for ratable pain for the last 1.5 weeks.  Patient works for [employer], does a lot of heavy lifting, bending, pulling on objects.  Patient feels that symptom onset was after chopping wood.  Patient denies known injury, popping/snapping/tearing sensation.  Patient does have some pain with firm grip and pronation such as pouring a cup of coffee.  No numbness, tingling distally.  Patient has taken naproxen with adequate relief, though "does not like to take medications "so does not take this regularly.  Denies trauma to the area.   Past Medical History:  Diagnosis Date  . Sleep apnea     There are no active problems to display for this patient.   Past Surgical History:  Procedure Laterality Date  . ANKLE SURGERY Right   . ELBOW SURGERY         Home Medications    Prior to Admission medications   Medication Sig Start Date End Date Taking? Authorizing Provider  naproxen (NAPROSYN) 500 MG tablet Take 1 tablet (500 mg total) by mouth 2 (two) times daily as needed. 01/15/19   Hall-Potvin, [place], PA-C    Family History Family History  Problem Relation Age of Onset  . Cancer Father        Lung, Bone    Social History Social History   Tobacco Use  . Smoking status: Current Every Day Smoker    Packs/day: 0.25  . Smokeless tobacco: Never Used  Substance Use Topics  . Alcohol use: Yes    Alcohol/week: 0.0 standard drinks  . Drug use: No     Allergies   Patient has no known allergies.   Review of Systems Review of Systems  Constitutional: Negative for fatigue and fever.  Respiratory: Negative for cough and shortness of breath.   Cardiovascular: Negative for chest pain and palpitations.  Musculoskeletal:       Positive for right elbow pain   Neurological: Negative for weakness and numbness.     Physical Exam Triage Vital Signs ED Triage Vitals  Enc Vitals Group     BP      Pulse      Resp      Temp      Temp src      SpO2      Weight      Height      Head Circumference      Peak Flow      Pain Score      Pain Loc      Pain Edu?      Excl. in [place]?    No data found.  Updated Vital Signs BP 123/86 (BP Location: Left Arm)   Pulse 89   Temp 98.7 F (37.1 C) (Oral)   Resp 18   SpO2 96%    Physical Exam Constitutional:      General: [name] not in acute distress. HENT:     Head: Normocephalic and atraumatic.  Eyes:     General: No scleral icterus.    Pupils: Pupils are equal, round, and reactive to light.  Cardiovascular:     Rate and Rhythm: Normal rate.  Pulmonary:     Effort: Pulmonary effort is normal. No respiratory distress.  Breath sounds: No wheezing.  Musculoskeletal:     Comments: No obvious bony deformity, ecchymosis, erythema, rash overlying right elbow.  Patient has full active range of motion without crepitus; reports mild pulling sensation with full flexion over proximal radial head.  TTP over extensor tendons that is worse with pronation.  Grip strength 5/5 bilaterally/symmetric.  Neurovascularly intact.  Skin:    General: Skin is warm.     Capillary Refill: Capillary refill takes less than 2 seconds.     Coloration: Skin is not jaundiced.     Findings: No bruising.  Neurological:     General: No focal deficit present.     Mental Status: [name] alert.     Sensory: No sensory deficit.     Deep Tendon Reflexes: Reflexes normal.      UC Treatments / Results  Labs (all labs ordered are listed, but only abnormal results are displayed) Labs Reviewed - No data to display  EKG   Radiology No results found.  Procedures Procedures (including critical care time)  Medications Ordered in UC Medications - No data to display  Initial Impression / Assessment and Plan / UC Course   I have reviewed the triage vital signs and the nursing notes.  Pertinent labs & imaging results that were available during my care of the patient were reviewed by me and considered in my medical decision making (see chart for details).     Offered Ace wrap for compression: Patient declined-we will get elbow sleeve OTC/online.  Advised RICE, provided work note for light duty x1 week, encourage patient to take naproxen daily for the next 1 to 2 weeks to reduce inflammation.  Return precautions discussed, patient verbalized understanding and is agreeable to plan. Final Clinical Impressions(s) / UC Diagnoses   Final diagnoses:  Right elbow tendonitis     Discharge Instructions     May ice, rest, elevate the area(s) of pain.   May use OTC Tylenol, ibuprofen as needed for pain. Recommend wearing compression sleeve/Ace wrap, particularly at work. Return if you develop worsening pain, painful arm movement, numbness in your fingers/hand.    ED Prescriptions    Medication Sig Dispense Auth. Provider   naproxen (NAPROSYN) 500 MG tablet Take 1 tablet (500 mg total) by mouth 2 (two) times daily as needed. 30 tablet Hall-Potvin, [place], PA-C     PDMP not reviewed this encounter.   Odette Fraction [place], [place] 01/15/19 1324

## 2019-01-15 NOTE — Discharge Instructions (Signed)
May ice, rest, elevate the area(s) of pain.   May use OTC Tylenol, ibuprofen as needed for pain. Recommend wearing compression sleeve/Ace wrap, particularly at work. Return if you develop worsening pain, painful arm movement, numbness in your fingers/hand.

## 2019-01-15 NOTE — ED Triage Notes (Signed)
Pt presents to [facility] for assessment of right elbow pain x 1.5 weeks.  States he was chopping wood, and later that day began to have pain to his elbow.  C/o difficulty holding on to things with that arm.

## 2019-01-15 NOTE — ED Notes (Signed)
Patient able to ambulate independently  

## 2019-01-22 ENCOUNTER — Ambulatory Visit
Admission: EM | Admit: 2019-01-22 | Discharge: 2019-01-22 | Disposition: A | Discharge status: Home / Self Care | Payer: BC Managed Care – PPO | Attending: Physician Assistant | Admitting: Physician Assistant

## 2019-01-22 DIAGNOSIS — M778 Other enthesopathies, not elsewhere classified: Secondary | ICD-10-CM | POA: Diagnosis not present

## 2019-01-22 MED ORDER — PREDNISONE 50 MG PO TABS: 50.0000 mg | ORAL_TABLET | Freq: Every day | ORAL | 0 refills | Status: DC

## 2019-01-22 NOTE — Discharge Instructions (Signed)
Start prednisone as directed. Continue ice compress. Follow up with sports medicine if symptoms not improving.

## 2019-01-22 NOTE — ED Provider Notes (Signed)
[facility]    CSN: [id number] Arrival date & time: 01/22/19  0827      History   Chief Complaint Chief Complaint  Patient presents with  . Arm Pain    HPI Terrence Terry is a 38 y.o. male.   38 year old male comes in for evaluation of continued right elbow pain after being seen 1 week ago.  At that time, she was found to have right elbow tendinitis, and was started on naproxen.  He was put on light duty with weight restrictions.  Patient states symptoms have improved with naproxen, return to work few days ago with lifting, and symptoms has worsened since.  Denies injury/trauma.  Denies swelling.  Denies numbness, tingling.  Denies loss of grip strength. Continues to take naproxen without much relief.  Patient works at [employer] with heavy lifting.     Past Medical History:  Diagnosis Date  . Sleep apnea     There are no active problems to display for this patient.   Past Surgical History:  Procedure Laterality Date  . ANKLE SURGERY Right   . ELBOW SURGERY         Home Medications    Prior to Admission medications   Medication Sig Start Date End Date Taking? Authorizing Provider  predniSONE (DELTASONE) 50 MG tablet Take 1 tablet (50 mg total) by mouth daily with breakfast. 01/22/19   Ok Edwards, PA-C    Family History Family History  Problem Relation Age of Onset  . Cancer Father        Lung, Bone    Social History Social History   Tobacco Use  . Smoking status: Current Every Day Smoker    Packs/day: 0.25  . Smokeless tobacco: Never Used  Substance Use Topics  . Alcohol use: Yes    Alcohol/week: 0.0 standard drinks  . Drug use: No     Allergies   Patient has no known allergies.   Review of Systems Review of Systems  Reason unable to perform ROS: See HPI as above.     Physical Exam Triage Vital Signs ED Triage Vitals [01/22/19 0908]  Enc Vitals Group     BP 128/86     Pulse Rate 88     Resp 18     Temp 98.2 F (36.8 C)     Temp  Source Oral     SpO2 98 %     Weight      Height      Head Circumference      Peak Flow      Pain Score 4     Pain Loc      Pain Edu?      Excl. in [place]?    No data found.  Updated Vital Signs BP 128/86 (BP Location: Left Arm)   Pulse 88   Temp 98.2 F (36.8 C) (Oral)   Resp 18   SpO2 98%   Physical Exam Constitutional:      General: [name] not in acute distress.    Appearance: [name] well-developed. [name] not diaphoretic.  HENT:     Head: Normocephalic and atraumatic.  Eyes:     Conjunctiva/sclera: Conjunctivae normal.     Pupils: Pupils are equal, round, and reactive to light.  Pulmonary:     Effort: Pulmonary effort is normal. No respiratory distress.  Musculoskeletal:     Comments: No swelling, erythema, warmth, contusion.  Tenderness to palpation of lateral epicondyles of the right elbow, with mild  tenderness to palpation of proximal forearm.  No tenderness to palpation of shoulder, wrist, fingers.  Full range of motion.  Strength normal and equal bilaterally of shoulder, elbow, wrist.  Right grip strength 4 out of 5, left grip strength 5 out of 5.  Radial pulse 2+, cap refill less than 2 seconds.  Neurological:     Mental Status: [name] alert and oriented to person, place, and time.      UC Treatments / Results  Labs (all labs ordered are listed, but only abnormal results are displayed) Labs Reviewed - No data to display  EKG   Radiology No results found.  Procedures Procedures (including critical care time)  Medications Ordered in UC Medications - No data to display  Initial Impression / Assessment and Plan / UC Course  I have reviewed the triage vital signs and the nursing notes.  Pertinent labs & imaging results that were available during my care of the patient were reviewed by me and considered in my medical decision making (see chart for details).    History and exam consistent with tendinitis.  Naproxen seems to have improved symptoms initially,  but now with worsening.  Will switch to prednisone at this time.  Patient to apply ice compress.  Follow-up with sports medicine if symptoms do not improving.  Return precautions given.  Patient expresses understanding and agrees to plan.  Final Clinical Impressions(s) / UC Diagnoses   Final diagnoses:  Right elbow tendinitis   ED Prescriptions    Medication Sig Dispense Auth. Provider   predniSONE (DELTASONE) 50 MG tablet Take 1 tablet (50 mg total) by mouth daily with breakfast. 5 tablet Belinda Fisher, PA-C     PDMP not reviewed this encounter.   Belinda Fisher, PA-C 01/22/19 1204

## 2019-01-22 NOTE — ED Triage Notes (Signed)
Pt states seen here last wk for same and still no better. Pt c/o rt arm/elbow pain. Denies injury

## 2019-03-27 ENCOUNTER — Telehealth: Payer: Self-pay

## 2019-03-27 NOTE — Telephone Encounter (Signed)

## 2019-03-30 ENCOUNTER — Ambulatory Visit (INDEPENDENT_AMBULATORY_CARE_PROVIDER_SITE_OTHER): Payer: BC Managed Care – PPO | Admitting: Family Medicine

## 2019-03-30 ENCOUNTER — Other Ambulatory Visit: Payer: Self-pay

## 2019-03-30 VITALS — BP 121/82 | HR 77 | Temp 97.5°F | Resp 17 | Ht 67.0 in | Wt 259.0 lb

## 2019-03-30 DIAGNOSIS — G4733 Obstructive sleep apnea (adult) (pediatric): Secondary | ICD-10-CM | POA: Diagnosis not present

## 2019-03-30 DIAGNOSIS — F17210 Nicotine dependence, cigarettes, uncomplicated: Secondary | ICD-10-CM | POA: Diagnosis not present

## 2019-03-30 DIAGNOSIS — Z6841 Body Mass Index (BMI) 40.0 and over, adult: Secondary | ICD-10-CM | POA: Diagnosis not present

## 2019-03-30 DIAGNOSIS — Z720 Tobacco use: Secondary | ICD-10-CM

## 2019-03-30 NOTE — Progress Notes (Addendum)
Subjective:  Patient ID: Terrence Terry, male    DOB: 06/09/1980  Age: 39 y.o. MRN: [id number]  CC: Establish Care and Sleep Apnea ([facility] has [facility] machine but says that it's time to get it titrated again)   HPI Terrence Terry, 39 year old male new to the practice, who presents secondary to needing recertification/retitration of CPAP machine for treatment of obstructive sleep apnea.  He denies any other significant medical issues other than occasional nasal congestion due to allergic rhinitis for which he takes cetirizine.  He also states that he had not weighed himself in a while and did not realize how heavy he was so he guesses that he is also now obese.  He formerly served in [employer] in the [place].  He was deployed to [place].  He reports that he does have issues with posttraumatic stress disorder and this morning signed up for local social support group.  He has received services from the [place] in the past including obtaining the CPAP machine through the [place] but he states that he really does not like utilizing the [place].  He has had no issues with the use of CPAP and feels that he sleeps very well when he uses the CPAP.  He reports nightly use of CPAP machine for treatment of sleep apnea.  He denies any suicidal thoughts or ideations related to posttraumatic stress disorder.  Overall he feels well.  He currently works for [employer].  [name] married and has 2 children a daughter age 42 and a son age 11.  He does currently smoke about 5 to 6 cigarettes/day but states that his wife is also planning to stop smoking and he will attempt to stop smoking at that time as well.  He has stopped smoking in the past and states that he is usually able to stop cold [place] without the use of any nicotine replacement therapy.  He does have family history significant for father dying secondary to metastatic lung cancer and history of mother with hypertension.  He also has a brother who had a  stroke but reports that the stroke was brought on by drug use.  Patient reports that he is status post recent right elbow surgery secondary to tendinitis and has a history of a leg fracture as well as history of right ankle fracture.  Past Medical History:  Diagnosis Date  . Environmental allergies   . PTSD (post-traumatic stress disorder)   . Sleep apnea     Past Surgical History:  Procedure Laterality Date  . ANKLE SURGERY Right   . ELBOW SURGERY    . FRACTURE SURGERY     leg with hardware placed    Family History  Problem Relation Age of Onset  . Cancer Father        Lung, Bone    Social History   Tobacco Use  . Smoking status: Current Every Day Smoker    Packs/day: 0.25  . Smokeless tobacco: Never Used  Substance Use Topics  . Alcohol use: Yes    Alcohol/week: 0.0 standard drinks    ROS Review of Systems  Constitutional: Positive for fatigue (Occasional, mild). Negative for chills and fever.  HENT: Negative for sore throat and trouble swallowing.   Eyes: Negative for photophobia and visual disturbance.  Respiratory: Negative for cough and shortness of breath.   Cardiovascular: Negative for chest pain and palpitations.  Gastrointestinal: Negative for abdominal pain, blood in stool, constipation, diarrhea and  nausea.  Endocrine: Negative for cold intolerance, heat intolerance, polydipsia, polyphagia and polyuria.  Genitourinary: Negative for dysuria and frequency.  Musculoskeletal: Positive for arthralgias (Elbow status post recent surgery). Negative for back pain and gait problem.  Neurological: Negative for dizziness and headaches.  Hematological: Negative for adenopathy. Does not bruise/bleed easily.  Psychiatric/Behavioral: Negative for self-injury and suicidal ideas. The patient is nervous/anxious.     Objective:   Today's Vitals: BP 121/82   Pulse 77   Temp (!) 97.5 F (36.4 C) (Temporal)   Resp 17   Ht 5\' 7"  (1.702 m)   Wt 259 lb (117.5 kg)   SpO2  95%   BMI 40.57 kg/m   Physical Exam Vitals and nursing note reviewed.  Constitutional:      Appearance: Normal appearance. [name] obese.  HENT:     Mouth/Throat:     Comments: Mild posterior pharynx erythema and cobblestoning, slightly narrowed posterior airway due to body habitus/large tongue base Neck:     Vascular: No carotid bruit.     Comments: Large neck size Cardiovascular:     Rate and Rhythm: Normal rate and regular rhythm.  Pulmonary:     Effort: Pulmonary effort is normal.     Breath sounds: Normal breath sounds.  Abdominal:     Palpations: Abdomen is soft.     Tenderness: There is no abdominal tenderness. There is no right CVA tenderness, left CVA tenderness, guarding or rebound.  Musculoskeletal:        General: No deformity.     Cervical back: Normal range of motion and neck supple. No tenderness.     Right lower leg: No edema.     Left lower leg: No edema.  Lymphadenopathy:     Cervical: No cervical adenopathy.  Skin:    General: Skin is warm and dry.  Neurological:     General: No focal deficit present.     Mental Status: [name] alert and oriented to person, place, and time.  Psychiatric:        Mood and Affect: Mood normal.        Behavior: Behavior normal.     Assessment & Plan:   1. Obstructive sleep apnea Order placed for patient to have split-night study sleep study as he needs new titration/recertification as he has had his current CPAP machine at least 10 years.  Weight loss and regular exercise encouraged to also help with obstructive sleep apnea.  He was encouraged to call the office in the next 2 weeks if he has not been contacted regarding sleep study. - Split night study; Future  2. Obesity, morbid, BMI 40.0-49.9 (HCC) Discussed low-carb/low-fat diet and dividing his plate into thirds with one third of the plate with a lean meat and the other two thirds with fresh vegetables.  Discussed buying healthier snacks as he has 2 children and patient  is the primary cook for the family.  [name] also encouraged to engage in regular physical exercise and to try and incorporate exercise into his family routine.  [name] encouraged to schedule for annual well exam and have screening for diabetes as well as screening for lipid disorders.  [name] nonfasting at today's visit.  3. Tobacco use He reports that he currently smokes 4 to 5 cigarettes/day.  Discussed nicotine patches, lozenges and gum to help with nicotine cravings but patient states that he has been able to quit in the past without these.  [name] encouraged to check out the  [place] quit ([place]) website for additional help with smoking cessation.  He was also made aware that there is a clinical pharmacist available through this office to help with smoking cessation.  4-5 minutes of today's visit was utilized for discussion of smoking cessation.  Outpatient Encounter Medications as of 03/30/2019  Medication Sig  . cetirizine (ZYRTEC) 10 MG tablet Take 10 mg by mouth daily.  . [DISCONTINUED] predniSONE (DELTASONE) 50 MG tablet Take 1 tablet (50 mg total) by mouth daily with breakfast.   No facility-administered encounter medications on file as of 03/30/2019.    An After Visit Summary was printed and given to the patient.  Follow-up: Return in 6 months (on 09/27/2019) for chronic issues & schedule for CPE.    Cain Saupe MD

## 2019-03-30 NOTE — Patient Instructions (Addendum)
Living With Sleep Apnea Sleep apnea is a condition in which breathing pauses or becomes shallow during sleep. Sleep apnea is most commonly caused by a collapsed or blocked airway. People with sleep apnea snore loudly and have times when they gasp and stop breathing for 10 seconds or more during sleep. This happens over and over during the night. This disrupts your sleep and keeps your body from getting the rest that it needs, which can cause tiredness and lack of energy (fatigue) during the day. The breaks in breathing also interrupt the deep sleep that you need to feel rested. Even if you do not completely wake up from the gaps in breathing, your sleep may not be restful. You may also have a headache in the morning and low energy during the day, and you may feel anxious or depressed. How can sleep apnea affect me? Sleep apnea increases your chances of extreme tiredness during the day (daytime fatigue). It can also increase your risk for health conditions, such as:  Heart attack.  Stroke.  Diabetes.  Heart failure.  Irregular heartbeat.  High blood pressure. If you have daytime fatigue as a result of sleep apnea, you may be more likely to:  Perform poorly at school or work.  Fall asleep while driving.  Have difficulty with attention.  Develop depression or anxiety.  Become severely overweight (obese).  Have sexual dysfunction. What actions can I take to manage sleep apnea? Sleep apnea treatment   If you were given a device to open your airway while you sleep, use it only as told by your health care provider. You may be given: ? An oral appliance. This is a custom-made mouthpiece that shifts your lower jaw forward. ? A continuous positive airway pressure (CPAP) device. This device blows air through a mask when you breathe out (exhale). ? A nasal expiratory positive airway pressure (EPAP) device. This device has valves that you put into each nostril. ? A bi-level positive airway  pressure (BPAP) device. This device blows air through a mask when you breathe in (inhale) and breathe out (exhale).  You may need surgery if other treatments do not work for you. Sleep habits  Go to sleep and wake up at the same time every day. This helps set your internal clock (circadian rhythm) for sleeping. ? If you stay up later than usual, such as on weekends, try to get up in the morning within 2 hours of your normal wake time.  Try to get at least 7-9 hours of sleep each night.  Stop computer, tablet, and mobile phone use a few hours before bedtime.  Do not take long naps during the day. If you nap, limit it to 30 minutes.  Have a relaxing bedtime routine. Reading or listening to music may relax you and help you sleep.  Use your bedroom only for sleep. ? Keep your television and computer out of your bedroom. ? Keep your bedroom cool, dark, and quiet. ? Use a supportive mattress and pillows.  Follow your health care provider's instructions for other changes to sleep habits. Nutrition  Do not eat heavy meals in the evening.  Do not have caffeine in the later part of the day. The effects of caffeine can last for more than 5 hours.  Follow your health care provider's or dietitian's instructions for any diet changes. Lifestyle      Do not drink alcohol before bedtime. Alcohol can cause you to fall asleep at first, but then it can cause   you to wake up in the middle of the night and have trouble getting back to sleep.  Do not use any products that contain nicotine or tobacco, such as cigarettes and e-cigarettes. If you need help quitting, ask your health care provider. Medicines  Take over-the-counter and prescription medicines only as told by your health care provider.  Do not use over-the-counter sleep medicine. You can become dependent on this medicine, and it can make sleep apnea worse.  Do not use medicines, such as sedatives and narcotics, unless told by your health  care provider. Activity  Exercise on most days, but avoid exercising in the evening. Exercising near bedtime can interfere with sleeping.  If possible, spend time outside every day. Natural light helps regulate your circadian rhythm. General information  Lose weight if you need to, and maintain a healthy weight.  Keep all follow-up visits as told by your health care provider. This is important.  If you are having surgery, make sure to tell your health care provider that you have sleep apnea. You may need to bring your device with you. Where to find more information Learn more about sleep apnea and daytime fatigue from:  American Sleep Association: sleepassociation.org  National Sleep Foundation: sleepfoundation.org  National Heart, Lung, and Blood Institute: [employer] Summary  Sleep apnea can cause daytime fatigue and other serious health conditions.  Both sleep apnea and daytime fatigue can be bad for your health and well-being.  You may need to wear a device while sleeping to help keep your airway open.  If you are having surgery, make sure to tell your health care provider that you have sleep apnea. You may need to bring your device with you.  Making changes to sleep habits, diet, lifestyle, and activity can help you manage sleep apnea. This information is not intended to replace advice given to you by your health care provider. Make sure you discuss any questions you have with your health care provider. Document Revised: 06/27/2018 Document Reviewed: 05/30/2017 Elsevier Patient Education  2020 Elsevier Inc.  Obesity, Adult Obesity is having too much body fat. Being obese means that your weight is more than what is healthy for you. BMI is a number that explains how much body fat you have. If you have a BMI of 30 or more, you are obese. Obesity is often caused by eating or drinking more calories than your body uses. Changing your lifestyle can help you lose  weight. Obesity can cause serious health problems, such as:  Stroke.  Coronary artery disease (CAD).  Type 2 diabetes.  Some types of cancer, including cancers of the colon, breast, uterus, and gallbladder.  Osteoarthritis.  High blood pressure (hypertension).  High cholesterol.  Sleep apnea.  Gallbladder stones.  Infertility problems. What are the causes?  Eating meals each day that are high in calories, sugar, and fat.  Being born with genes that may make you more likely to become obese.  Having a medical condition that causes obesity.  Taking certain medicines.  Sitting a lot (having a sedentary lifestyle).  Not getting enough sleep.  Drinking a lot of drinks that have sugar in them. What increases the risk?  Having a family history of obesity.  Being an [place] American woman.  Being a Hispanic man.  Living in an area with limited access to: ? [place] [facility], recreation centers, or sidewalks. ? Healthy food choices, such as grocery stores and farmers' markets. What are the signs or symptoms? The main sign is having too  much body fat. How is this treated?  Treatment for this condition often includes changing your lifestyle. Treatment may include: ? Changing your diet. This may include making a healthy meal plan. ? Exercise. This may include activity that causes your heart to beat faster (aerobic exercise) and strength training. Work with your doctor to design a program that works for you. ? Medicine to help you lose weight. This may be used if you are not able to lose 1 pound a week after 6 weeks of healthy eating and more exercise. ? Treating conditions that cause the obesity. ? Surgery. Options may include gastric banding and gastric bypass. This may be done if:  Other treatments have not helped to improve your condition.  You have a BMI of 40 or higher.  You have life-threatening health problems related to obesity. Follow these instructions at  home: Eating and drinking   Follow advice from your doctor about what to eat and drink. Your doctor may tell you to: ? Limit fast food, sweets, and processed snack foods. ? Choose low-fat options. For example, choose low-fat milk instead of whole milk. ? Eat 5 or more servings of fruits or vegetables each day. ? Eat at home more often. This gives you more control over what you eat. ? Choose healthy foods when you eat out. ? Learn to read food labels. This will help you learn how much food is in 1 serving. ? Keep low-fat snacks available. ? Avoid drinks that have a lot of sugar in them. These include soda, fruit juice, iced tea with sugar, and flavored milk.  Drink enough water to keep your pee (urine) pale yellow.  Do not go on fad diets. Physical activity  Exercise often, as told by your doctor. Most adults should get up to 150 minutes of moderate-intensity exercise every week.Ask your doctor: ? What types of exercise are safe for you. ? How often you should exercise.  Warm up and stretch before being active.  Do slow stretching after being active (cool down).  Rest between times of being active. Lifestyle  Work with your doctor and a food expert (dietitian) to set a weight-loss goal that is best for you.  Limit your screen time.  Find ways to reward yourself that do not involve food.  Do not drink alcohol if: ? Your doctor tells you not to drink. ? You are pregnant, may be pregnant, or are planning to become pregnant.  If you drink alcohol: ? Limit how much you use to:  0-1 drink a day for women.  0-2 drinks a day for men. ? Be aware of how much alcohol is in your drink. In the [place] one drink equals one 12 oz bottle of beer (355 mL), one 5 oz glass of wine (148 mL), or one 1 oz glass of hard liquor (44 mL). General instructions  Keep a weight-loss journal. This can help you keep track of: ? The food that you eat. ? How much exercise you get.  Take  over-the-counter and prescription medicines only as told by your doctor.  Take vitamins and supplements only as told by your doctor.  Think about joining a support group.  Keep all follow-up visits as told by your doctor. This is important. Contact a doctor if:  You cannot meet your weight loss goal after you have changed your diet and lifestyle for 6 weeks. Get help right away if you:  Are having trouble breathing.  Are having thoughts of harming yourself.  Summary  Obesity is having too much body fat.  Being obese means that your weight is more than what is healthy for you.  Work with your doctor to set a weight-loss goal.  Get regular exercise as told by your doctor. This information is not intended to replace advice given to you by your health care provider. Make sure you discuss any questions you have with your health care provider. Document Revised: 11/07/2017 Document Reviewed: 11/07/2017 Elsevier Patient Education  2020 [employer].

## 2019-04-23 ENCOUNTER — Other Ambulatory Visit (HOSPITAL_COMMUNITY)
Admission: RE | Admit: 2019-04-23 | Discharge: 2019-04-23 | Disposition: A | Discharge status: Home / Self Care | Payer: BC Managed Care – PPO | Source: Ambulatory Visit | Attending: Internal Medicine | Admitting: Internal Medicine

## 2019-04-23 DIAGNOSIS — Z01812 Encounter for preprocedural laboratory examination: Secondary | ICD-10-CM | POA: Insufficient documentation

## 2019-04-23 DIAGNOSIS — Z20822 Contact with and (suspected) exposure to covid-19: Secondary | ICD-10-CM | POA: Diagnosis not present

## 2019-04-23 LAB — SARS CORONAVIRUS 2 (TAT 6-24 HRS): SARS Coronavirus 2: NEGATIVE

## 2019-04-25 ENCOUNTER — Other Ambulatory Visit: Payer: Self-pay

## 2019-04-25 ENCOUNTER — Ambulatory Visit (HOSPITAL_BASED_OUTPATIENT_CLINIC_OR_DEPARTMENT_OTHER): Payer: BC Managed Care – PPO | Attending: Family Medicine | Admitting: Internal Medicine

## 2019-04-25 DIAGNOSIS — G4733 Obstructive sleep apnea (adult) (pediatric): Secondary | ICD-10-CM | POA: Diagnosis present

## 2019-04-27 ENCOUNTER — Other Ambulatory Visit (HOSPITAL_BASED_OUTPATIENT_CLINIC_OR_DEPARTMENT_OTHER): Payer: Self-pay

## 2019-04-27 DIAGNOSIS — G4733 Obstructive sleep apnea (adult) (pediatric): Secondary | ICD-10-CM

## 2019-05-03 ENCOUNTER — Other Ambulatory Visit: Payer: Self-pay | Admitting: Family Medicine

## 2019-05-03 DIAGNOSIS — G4733 Obstructive sleep apnea (adult) (pediatric): Secondary | ICD-10-CM

## 2019-05-03 NOTE — Progress Notes (Signed)
Patient ID: Terrence Terry, male   DOB: Feb 16, 1981, 39 y.o.   MRN: [id number]   Patient is s/p sleep study on 04/25/2019 which was consistent with severe OSA. AHI was 66.1/hr and 10 cm water pressure recommended along with standard size Resmed Nasal mask Mirage Activa Mask and heated humidification. Order will be placed. Will give order to RN case manager who can contact patient to find out where he would like to obtain CPAP and supplies

## 2019-05-03 NOTE — Procedures (Signed)
Patient Name: Terrence Terry, Orth Date: 04/25/2019 Gender: Male D.O.B: 12/24/1980 Age (years): 29 Referring Provider: Cammie Fulp Height (inches): 68 Interpreting Physician: Baird Lyons MD, ABSM Weight (lbs): 260 RPSGT: Earney [place] BMI: 40 MRN: [id number] Neck Size: 16.50  CLINICAL INFORMATION Sleep Study Type: Split Night CPAP Indication for sleep study: OSA Epworth Sleepiness Score: 4  SLEEP STUDY TECHNIQUE As per the AASM Manual for the Scoring of Sleep and Associated Events v2.3 (April 2016) with a hypopnea requiring 4% desaturations.  The channels recorded and monitored were frontal, central and occipital EEG, electrooculogram (EOG), submentalis EMG (chin), nasal and oral airflow, thoracic and abdominal wall motion, anterior tibialis EMG, snore microphone, electrocardiogram, and pulse oximetry. Continuous positive airway pressure (CPAP) was initiated when the patient met split night criteria and was titrated according to treat sleep-disordered breathing.  MEDICATIONS Medications self-administered by patient taken the night of the study : none reported  RESPIRATORY PARAMETERS Diagnostic  Total AHI (/hr): 66.1 RDI (/hr): 84.6 OA Index (/hr): 9.9 CA Index (/hr): 0.0 REM AHI (/hr): N/A NREM AHI (/hr): 66.1 Supine AHI (/hr): 66.1 Non-supine AHI (/hr): 0 Min O2 Sat (%): 78.0 Mean O2 (%): 93.2 Time below 88% (min): 10.5   Titration  Optimal Pressure (cm): 10 AHI at Optimal Pressure (/hr): 0.0 Min O2 at Optimal Pressure (%): 90.0 Supine % at Optimal (%): 98 Sleep % at Optimal (%): 98   SLEEP ARCHITECTURE The recording time for the entire night was 425.5 minutes.  During a baseline period of 144.6 minutes, the patient slept for 127.0 minutes in REM and nonREM, yielding a sleep efficiency of 87.9%%. Sleep onset after lights out was 7.9 minutes with a REM latency of N/A minutes. The patient spent 1.2%% of the night in stage N1 sleep, 98.8%% in stage N2 sleep, 0.0%% in  stage N3 and 0% in REM.  During the titration period of 274.0 minutes, the patient slept for 266.1 minutes in REM and nonREM, yielding a sleep efficiency of 97.1%%. Sleep onset after CPAP initiation was 3.9 minutes with a REM latency of 11.5 minutes. The patient spent 1.1%% of the night in stage N1 sleep, 73.9%% in stage N2 sleep, 0.0%% in stage N3 and 25% in REM.  CARDIAC DATA The 2 lead EKG demonstrated sinus rhythm. The mean heart rate was 100.0 beats per minute. Other EKG findings include: None.  LEG MOVEMENT DATA The total Periodic Limb Movements of Sleep (PLMS) were 0. The PLMS index was 0.0 .  IMPRESSIONS - Severe obstructive sleep apnea occurred during the diagnostic portion of the study (AHI = 66.1/hour). An optimal PAP pressure was selected for this patient ( 10 cm of water) - No significant central sleep apnea occurred during the diagnostic portion of the study (CAI = 0.0/hour). - The patient had minimal or no oxygen desaturation during the diagnostic portion of the study (Min O2 = 78.0%) Minimum sat at CPAP 10 was 90%. - The patient snored with soft snoring volume during the diagnostic portion of the study. - No cardiac abnormalities were noted during this study. - Clinically significant periodic limb movements did not occur during sleep.  DIAGNOSIS - Obstructive Sleep Apnea (327.23 [G47.33 ICD-10])  RECOMMENDATIONS - Trial of CPAP therapy on 10 cm H2O or autopap 5-15. - Patient used a Standard size Resmed Nasal Mask Mirage Activa mask and heated humidification. - Be careful with alcohol, sedatives and other CNS depressants that may worsen sleep apnea and disrupt normal sleep architecture. - Sleep hygiene should be reviewed  to assess factors that may improve sleep quality. - Weight management and regular exercise should be initiated or continued.  [Electronically signed] 05/03/2019 10:31 AM  Baird Lyons MD, ABSM Diplomate, American Board of Sleep Medicine   NPI:  [id number]                    [place] [place] [facility]  ELECTRONICALLY SIGNED ON:  05/03/2019, 10:28 AM [place] PH: [phone]   FX: [phone] [place]

## 2019-05-04 ENCOUNTER — Telehealth: Payer: Self-pay

## 2019-05-04 NOTE — Telephone Encounter (Signed)
Call placed to patient to inquire about preference for DME company to provide CPAP and he had no preference.  Order faxed to [facility]

## 2019-05-06 ENCOUNTER — Telehealth: Payer: Self-pay | Admitting: General Practice

## 2019-05-06 NOTE — Telephone Encounter (Signed)
Patient called requesting to speak with [facility]. Patient stated that he was returning her call. Please follow up at your earliest convenience.

## 2019-05-07 NOTE — Telephone Encounter (Signed)
Spoke with pt / refer to notes on 04/27/2019

## 2019-07-16 ENCOUNTER — Other Ambulatory Visit: Payer: Self-pay

## 2019-07-16 ENCOUNTER — Encounter: Payer: Self-pay | Admitting: Physician Assistant

## 2019-07-16 ENCOUNTER — Ambulatory Visit
Admission: EM | Admit: 2019-07-16 | Discharge: 2019-07-16 | Disposition: A | Discharge status: Home / Self Care | Payer: BC Managed Care – PPO | Attending: Physician Assistant | Admitting: Physician Assistant

## 2019-07-16 DIAGNOSIS — R05 Cough: Secondary | ICD-10-CM

## 2019-07-16 DIAGNOSIS — R0981 Nasal congestion: Secondary | ICD-10-CM

## 2019-07-16 DIAGNOSIS — R059 Cough, unspecified: Secondary | ICD-10-CM

## 2019-07-16 MED ORDER — FLUTICASONE PROPIONATE 50 MCG/ACT NA SUSP: 2.0000 | Freq: Every day | NASAL | 0 refills | Status: AC

## 2019-07-16 MED ORDER — BENZONATATE 200 MG PO CAPS: 200.0000 mg | ORAL_CAPSULE | Freq: Three times a day (TID) | ORAL | 0 refills | Status: DC

## 2019-07-16 MED ORDER — AZELASTINE HCL 0.1 % NA SOLN: 2.0000 | Freq: Two times a day (BID) | NASAL | 0 refills | Status: DC

## 2019-07-16 NOTE — ED Triage Notes (Signed)
Pt c/o cough and nasal congestion since Monday.

## 2019-07-16 NOTE — Discharge Instructions (Signed)
COVID PCR testing ordered. I would like you to quarantine until testing results. Tessalon for  cough. Azelastine and flonase nasal spray for congestion/drainage. Tylenol/motrin for pain and fever. Keep hydrated, urine should be clear to pale yellow in color. If experiencing shortness of breath, trouble breathing, go to the emergency department for further evaluation needed.

## 2019-07-16 NOTE — ED Provider Notes (Signed)
[facility]    CSN: [id number] Arrival date & time: 07/16/19  4098      History   Chief Complaint Chief Complaint  Patient presents with  . Cough    HPI Terrence Terry is a 39 y.o. male.   39 year old male comes in for 3-4 day of URI symptoms. Nasal congestion, rhinorrhea, cough, body aches. Denies fever, chills, body aches. Denies abdominal pain, nausea, vomiting, diarrhea. Denies shortness of breath, loss of taste/smell. COVID pfizer finished 07/07/2019. Current every day smoker.      Past Medical History:  Diagnosis Date  . Anxiety   . Blood pressure elevated without history of HTN   . Environmental allergies   . H/O alcohol abuse   . PTSD (post-traumatic stress disorder)   . Sleep apnea     There are no problems to display for this patient.   Past Surgical History:  Procedure Laterality Date  . ANKLE SURGERY Right   . ELBOW SURGERY    . FRACTURE SURGERY     leg with hardware placed       Home Medications    Prior to Admission medications   Medication Sig Start Date End Date Taking? Authorizing Provider  azelastine (ASTELIN) 0.1 % nasal spray Place 2 sprays into both nostrils 2 (two) times daily. 07/16/19   Tasia Catchings, Rhegan Trunnell V, PA-C  benzonatate (TESSALON) 200 MG capsule Take 1 capsule (200 mg total) by mouth every 8 (eight) hours. 07/16/19   Tasia Catchings, Kanyon Seibold V, PA-C  cetirizine (ZYRTEC) 10 MG tablet Take 10 mg by mouth daily.    [provider]  fluticasone (FLONASE) 50 MCG/ACT nasal spray Place 2 sprays into both nostrils daily. 07/16/19   Ok Edwards, PA-C    Family History Family History  Problem Relation Age of Onset  . Hypertension Mother   . Bipolar disorder Mother   . Depression Mother   . Hyperlipidemia Mother   . Lung cancer Father   . Alcoholism Father   . Stroke Brother     Social History Social History   Tobacco Use  . Smoking status: Current Every Day Smoker    Packs/day: 0.25  . Smokeless tobacco: Never Used  Substance Use  Topics  . Alcohol use: Yes    Alcohol/week: 0.0 standard drinks  . Drug use: No     Allergies   Patient has no known allergies.   Review of Systems Review of Systems  Reason unable to perform ROS: See HPI as above.     Physical Exam Triage Vital Signs ED Triage Vitals  Enc Vitals Group     BP      Pulse      Resp      Temp      Temp src      SpO2      Weight      Height      Head Circumference      Peak Flow      Pain Score      Pain Loc      Pain Edu?      Excl. in [place]?    No data found.  Updated Vital Signs BP 107/77 (BP Location: Left Arm)   Pulse 100   Temp 98.5 F (36.9 C) (Oral)   Resp 16   SpO2 95%   Physical Exam Constitutional:      General: [name] not in acute distress.    Appearance: Normal appearance. [name] not ill-appearing,  toxic-appearing or diaphoretic.  HENT:     Head: Normocephalic and atraumatic.     Right Ear: Ear canal and external ear normal. A middle ear effusion is present. Tympanic membrane is not erythematous or bulging.     Left Ear: Ear canal and external ear normal. A middle ear effusion is present. Tympanic membrane is not erythematous or bulging.     Nose:     Right Sinus: Maxillary sinus tenderness present. No frontal sinus tenderness.     Left Sinus: Maxillary sinus tenderness present. No frontal sinus tenderness.     Mouth/Throat:     Mouth: Mucous membranes are moist.     Pharynx: Oropharynx is clear. Uvula midline.  Cardiovascular:     Rate and Rhythm: Normal rate and regular rhythm.     Heart sounds: Normal heart sounds. No murmur. No friction rub. No gallop.   Pulmonary:     Effort: Pulmonary effort is normal. No accessory muscle usage, prolonged expiration, respiratory distress or retractions.     Comments: Lungs clear to auscultation without adventitious lung sounds. Musculoskeletal:     Cervical back: Normal range of motion and neck supple.  Neurological:     General: No focal deficit present.     Mental  Status: [name] alert and oriented to person, place, and time.      UC Treatments / Results  Labs (all labs ordered are listed, but only abnormal results are displayed) Labs Reviewed  NOVEL CORONAVIRUS, NAA    EKG   Radiology No results found.  Procedures Procedures (including critical care time)  Medications Ordered in UC Medications - No data to display  Initial Impression / Assessment and Plan / UC Course  I have reviewed the triage vital signs and the nursing notes.  Pertinent labs & imaging results that were available during my care of the patient were reviewed by me and considered in my medical decision making (see chart for details).    COVID PCR test ordered. Patient to quarantine until testing results return. No alarming signs on exam.  Patient speaking in full sentences without respiratory distress.  Symptomatic treatment discussed.  Push fluids.  Return precautions given.  Patient expresses understanding and agrees to plan.  Final Clinical Impressions(s) / UC Diagnoses   Final diagnoses:  Nasal congestion  Cough    ED Prescriptions    Medication Sig Dispense Auth. Provider   azelastine (ASTELIN) 0.1 % nasal spray Place 2 sprays into both nostrils 2 (two) times daily. 30 mL Soren Pigman V, PA-C   fluticasone (FLONASE) 50 MCG/ACT nasal spray Place 2 sprays into both nostrils daily. 1 g Trayce Maino V, PA-C   benzonatate (TESSALON) 200 MG capsule Take 1 capsule (200 mg total) by mouth every 8 (eight) hours. 21 capsule Belinda Fisher, PA-C     PDMP not reviewed this encounter.   Belinda Fisher, PA-C 07/16/19 [phone]

## 2019-07-17 LAB — NOVEL CORONAVIRUS, NAA: SARS-CoV-2, NAA: NOT DETECTED

## 2019-07-17 LAB — SARS-COV-2, NAA 2 DAY TAT

## 2019-08-14 ENCOUNTER — Telehealth: Payer: Self-pay

## 2019-08-14 NOTE — Telephone Encounter (Signed)
Message received from [facility] noting that the patient was set up with his CPAP on 05/15/2019

## 2019-10-15 ENCOUNTER — Encounter: Payer: Self-pay | Admitting: Emergency Medicine

## 2019-10-15 ENCOUNTER — Other Ambulatory Visit: Payer: Self-pay

## 2019-10-15 ENCOUNTER — Ambulatory Visit
Admission: EM | Admit: 2019-10-15 | Discharge: 2019-10-15 | Disposition: A | Discharge status: Home / Self Care | Payer: BC Managed Care – PPO | Attending: Physician Assistant | Admitting: Physician Assistant

## 2019-10-15 DIAGNOSIS — M7989 Other specified soft tissue disorders: Secondary | ICD-10-CM

## 2019-10-15 DIAGNOSIS — T63441A Toxic effect of venom of bees, accidental (unintentional), initial encounter: Secondary | ICD-10-CM

## 2019-10-15 MED ORDER — DEXAMETHASONE SODIUM PHOSPHATE 10 MG/ML IJ SOLN
10.0000 mg | Freq: Once | INTRAMUSCULAR | Status: AC
  Administered 2019-10-15: 10 mg via INTRAMUSCULAR

## 2019-10-15 MED ORDER — PREDNISONE 50 MG PO TABS: 50.0000 mg | ORAL_TABLET | Freq: Every day | ORAL | 0 refills | Status: DC

## 2019-10-15 NOTE — ED Notes (Signed)
Patient able to ambulate independently  

## 2019-10-15 NOTE — ED Triage Notes (Addendum)
Pt presents to [facility] for assessment of left ankle pain and foot pain after being stung by an insect yesterday.  Swelling, redness, rash noted.  Patient states he tried Benadryl, Tylenol and Naproxen without relief.

## 2019-10-15 NOTE — Discharge Instructions (Signed)
Decadron injection in office today. Prednisone as directed. Continue antihistamine like zyrtec as directed. Continue ice compress, elevation, rest. If worsening symptoms, spreading erythema, warmth, follow up for reevaluation.

## 2019-10-15 NOTE — ED Provider Notes (Signed)
[facility]    CSN: [id number] Arrival date & time: 10/15/19  0816      History   Chief Complaint Chief Complaint  Patient presents with  . Insect Bite    HPI Terrence Terry is a 39 y.o. male.   39 year old male comes in for left lower extremity swelling after being stung by an insect to the ankle yesterday.  States significant swelling in the first 3 hours, and has been slowly increasing.  Denies throat irritation, oral swelling, trouble breathing.  Benadryl, Tylenol, naproxen without relief.  Denies history of allergies to insect/bee sting.  States prior to current symptom onset, had 2 episodes of poison ivy/oak, and still having mild symptoms from this.      Past Medical History:  Diagnosis Date  . Anxiety   . Blood pressure elevated without history of HTN   . Environmental allergies   . H/O alcohol abuse   . PTSD (post-traumatic stress disorder)   . Sleep apnea     There are no problems to display for this patient.   Past Surgical History:  Procedure Laterality Date  . ANKLE SURGERY Right   . ELBOW SURGERY    . FRACTURE SURGERY     leg with hardware placed       Home Medications    Prior to Admission medications   Medication Sig Start Date End Date Taking? Authorizing Provider  azelastine (ASTELIN) 0.1 % nasal spray Place 2 sprays into both nostrils 2 (two) times daily. 07/16/19   Cathie Hoops, Dickie Cloe V, PA-C  benzonatate (TESSALON) 200 MG capsule Take 1 capsule (200 mg total) by mouth every 8 (eight) hours. 07/16/19   Cathie Hoops, Jaquavion Mccannon V, PA-C  cetirizine (ZYRTEC) 10 MG tablet Take 10 mg by mouth daily.    [provider]  fluticasone (FLONASE) 50 MCG/ACT nasal spray Place 2 sprays into both nostrils daily. 07/16/19   Belinda Fisher, PA-C  predniSONE (DELTASONE) 50 MG tablet Take 1 tablet (50 mg total) by mouth daily with breakfast. 10/15/19   Belinda Fisher, PA-C    Family History Family History  Problem Relation Age of Onset  . Hypertension Mother   . Bipolar  disorder Mother   . Depression Mother   . Hyperlipidemia Mother   . Lung cancer Father   . Alcoholism Father   . Stroke Brother     Social History Social History   Tobacco Use  . Smoking status: Current Every Day Smoker    Packs/day: 0.25  . Smokeless tobacco: Never Used  Substance Use Topics  . Alcohol use: Yes    Alcohol/week: 0.0 standard drinks  . Drug use: No     Allergies   Patient has no known allergies.   Review of Systems Review of Systems  Reason unable to perform ROS: See HPI as above.     Physical Exam Triage Vital Signs ED Triage Vitals  Enc Vitals Group     BP 10/15/19 0826 115/76     Pulse Rate 10/15/19 0826 80     Resp 10/15/19 0826 16     Temp 10/15/19 0826 97.8 F (36.6 C)     Temp Source 10/15/19 0826 Oral     SpO2 10/15/19 0826 98 %     Weight --      Height --      Head Circumference --      Peak Flow --      Pain Score 10/15/19 0828 7  Pain Loc --      Pain Edu? --      Excl. in GC? --    No data found.  Updated Vital Signs BP 115/76 (BP Location: Right Arm)   Pulse 80   Temp 97.8 F (36.6 C) (Oral)   Resp 16   SpO2 98%   Physical Exam Constitutional:      General: [name] not in acute distress.    Appearance: Normal appearance. [name] well-developed. [name] not toxic-appearing or diaphoretic.  HENT:     Head: Normocephalic and atraumatic.  Eyes:     Conjunctiva/sclera: Conjunctivae normal.     Pupils: Pupils are equal, round, and reactive to light.  Pulmonary:     Effort: Pulmonary effort is normal. No respiratory distress.  Musculoskeletal:     Cervical back: Normal range of motion and neck supple.     Comments: Diffuse swelling to the left lower extremity up to the knee. No erythema. Slight warmth. No tenderness to palpation. No pitting edema. Few excoriations to the anterior leg from poison ivy. NVI  Skin:    General: Skin is warm and dry.  Neurological:     Mental Status: [name] alert and oriented to person,  place, and time.      UC Treatments / Results  Labs (all labs ordered are listed, but only abnormal results are displayed) Labs Reviewed - No data to display  EKG   Radiology No results found.  Procedures Procedures (including critical care time)  Medications Ordered in UC Medications  dexamethasone (DECADRON) injection 10 mg (10 mg Intramuscular Given 10/15/19 0912)    Initial Impression / Assessment and Plan / UC Course  I have reviewed the triage vital signs and the nursing notes.  Pertinent labs & imaging results that were available during my care of the patient were reviewed by me and considered in my medical decision making (see chart for details).    Decadron injection in office today.  Prednisone as directed.  Antihistamine, ice compress, elevation, rest.  Return precautions given.  Patient expresses understanding and agrees to plan.  Final Clinical Impressions(s) / UC Diagnoses   Final diagnoses:  Left leg swelling  Bee sting reaction, accidental or unintentional, initial encounter    ED Prescriptions    Medication Sig Dispense Auth. Provider   predniSONE (DELTASONE) 50 MG tablet Take 1 tablet (50 mg total) by mouth daily with breakfast. 5 tablet Belinda Fisher, PA-C     PDMP not reviewed this encounter.   Belinda Fisher, PA-C 10/15/19 [phone]

## 2021-04-03 ENCOUNTER — Ambulatory Visit: Payer: BC Managed Care – PPO | Admitting: Family Medicine

## 2021-04-06 ENCOUNTER — Encounter: Payer: Self-pay | Admitting: Emergency Medicine

## 2021-04-06 ENCOUNTER — Other Ambulatory Visit: Payer: Self-pay

## 2021-04-06 ENCOUNTER — Emergency Department (HOSPITAL_COMMUNITY): Payer: BC Managed Care – PPO

## 2021-04-06 ENCOUNTER — Ambulatory Visit
Admission: EM | Admit: 2021-04-06 | Discharge: 2021-04-06 | Disposition: A | Discharge status: Home / Self Care | Payer: BC Managed Care – PPO

## 2021-04-06 ENCOUNTER — Emergency Department (HOSPITAL_COMMUNITY)
Admission: EM | Admit: 2021-04-06 | Discharge: 2021-04-06 | Disposition: A | Discharge status: Home / Self Care | Payer: BC Managed Care – PPO | Attending: Emergency Medicine | Admitting: Emergency Medicine

## 2021-04-06 ENCOUNTER — Encounter (HOSPITAL_COMMUNITY): Payer: Self-pay | Admitting: Emergency Medicine

## 2021-04-06 DIAGNOSIS — R109 Unspecified abdominal pain: Secondary | ICD-10-CM

## 2021-04-06 DIAGNOSIS — R1031 Right lower quadrant pain: Secondary | ICD-10-CM | POA: Diagnosis not present

## 2021-04-06 LAB — CBC WITH DIFFERENTIAL/PLATELET
Abs Immature Granulocytes: 0.03 10*3/uL (ref 0.00–0.07)
Basophils Absolute: 0.1 10*3/uL (ref 0.0–0.1)
Basophils Relative: 1 %
Eosinophils Absolute: 0.3 10*3/uL (ref 0.0–0.5)
Eosinophils Relative: 3 %
HCT: 46.3 % (ref 39.0–52.0)
Hemoglobin: 15.2 g/dL (ref 13.0–17.0)
Immature Granulocytes: 0 %
Lymphocytes Relative: 28 %
Lymphs Abs: 3 10*3/uL (ref 0.7–4.0)
MCH: 31 pg (ref 26.0–34.0)
MCHC: 32.8 g/dL (ref 30.0–36.0)
MCV: 94.3 fL (ref 80.0–100.0)
Monocytes Absolute: 0.6 10*3/uL (ref 0.1–1.0)
Monocytes Relative: 6 %
Neutro Abs: 6.7 10*3/uL (ref 1.7–7.7)
Neutrophils Relative %: 62 %
Platelets: 302 10*3/uL (ref 150–400)
RBC: 4.91 MIL/uL (ref 4.22–5.81)
RDW: 12.5 % (ref 11.5–15.5)
WBC: 10.6 10*3/uL — ABNORMAL HIGH (ref 4.0–10.5)
nRBC: 0 % (ref 0.0–0.2)

## 2021-04-06 LAB — URINALYSIS, ROUTINE W REFLEX MICROSCOPIC
Bilirubin Urine: NEGATIVE
Glucose, UA: NEGATIVE mg/dL
Hgb urine dipstick: NEGATIVE
Ketones, ur: NEGATIVE mg/dL
Leukocytes,Ua: NEGATIVE
Nitrite: NEGATIVE
Protein, ur: NEGATIVE mg/dL
Specific Gravity, Urine: >1.046 — ABNORMAL HIGH (ref 1.005–1.030)
pH: 5 (ref 5.0–8.0)

## 2021-04-06 LAB — COMPREHENSIVE METABOLIC PANEL
ALT: 29 U/L (ref 0–44)
AST: 23 U/L (ref 15–41)
Albumin: 4.2 g/dL (ref 3.5–5.0)
Alkaline Phosphatase: 78 U/L (ref 38–126)
Anion gap: 8 (ref 5–15)
BUN: 32 mg/dL — ABNORMAL HIGH (ref 6–20)
CO2: 22 mmol/L (ref 22–32)
Calcium: 8.6 mg/dL — ABNORMAL LOW (ref 8.9–10.3)
Chloride: 108 mmol/L (ref 98–111)
Creatinine, Ser: 0.97 mg/dL (ref 0.61–1.24)
GFR, Estimated: >60 mL/min (ref >60)
Glucose, Bld: 94 mg/dL (ref 70–99)
Potassium: 3.9 mmol/L (ref 3.5–5.1)
Sodium: 138 mmol/L (ref 135–145)
Total Bilirubin: 0.5 mg/dL (ref 0.3–1.2)
Total Protein: 7.5 g/dL (ref 6.5–8.1)

## 2021-04-06 LAB — LIPASE, BLOOD: Lipase: 40 U/L (ref 11–51)

## 2021-04-06 MED ORDER — IOHEXOL 300 MG/ML  SOLN
100.0000 mL | Freq: Once | INTRAMUSCULAR | Status: AC | PRN
  Administered 2021-04-06: 100 mL via INTRAVENOUS

## 2021-04-06 NOTE — Discharge Instructions (Addendum)
Please follow-up with primary care doctor regarding your symptoms from today.  Take Tylenol or Motrin as needed for pain control.  If you develop worsening pain, vomiting, fever or other new concerning symptom, come back to ER for reassessment.

## 2021-04-06 NOTE — ED Provider Triage Note (Signed)
Emergency Medicine Provider Triage Evaluation Note  Terrence Terry , a 41 y.o. male  was evaluated in triage.  Pt complains of right lower quadrant abdominal pain that has been present for about 3 days.  He says it started in the right lower quadrant.  Pain has been constant.  Pain is worse with movements.  Pain actually feels better when he urinates.  Its not really gotten worse or better.  He was seen by urgent care today who sent him to the emergency department to have appendicitis ruled out.  He denies any fevers or chills.  No nausea or vomiting.  No dysuria or hematuria.  No flank pain.  Denies diarrhea or constipation.  Review of Systems  Positive: Abdominal pain Negative:   Physical Exam  BP (!) 127/92 (BP Location: Left Arm)    Pulse 94    Temp 97.7 F (36.5 C) (Oral)    Resp 15    Ht 5\' 8"  (1.727 m)    Wt 108.9 kg    SpO2 96%    BMI 36.49 kg/m  Gen:   Awake, no distress   Resp:  Normal effort  MSK:   Moves extremities without difficulty  Other:  Right lower quadrant tenderness.  Not severe.  No peritoneal signs.  Medical Decision Making  Medically screening exam initiated at 4:49 PM.  Appropriate orders placed.  Terrence Terry was informed that the remainder of the evaluation will be completed by another provider, this initial triage assessment does not replace that evaluation, and the importance of remaining in the ED until their evaluation is complete.  Abdominal labs and CT abdomen ordered.   Terrence Birchwood, PA-C 04/06/21 1650

## 2021-04-06 NOTE — Discharge Instructions (Addendum)
Please go to the hospital as soon as you leave urgent care for further evaluation and management. 

## 2021-04-06 NOTE — ED Triage Notes (Signed)
RLQ abdominal pain starting 2 days prior. No hx of same. Picking up things makes pain worse. Positional changes mildly relieve symptoms. Denies fever, nausea, vomiting. Reports not having had his normal BM recently, more constipated. Denies any new lumps in area.

## 2021-04-06 NOTE — ED Provider Notes (Signed)
[facility]    CSN: [id number] Arrival date & time: 04/06/21  1418      History   Chief Complaint Chief Complaint  Patient presents with   Abdominal Pain    HPI Terrence Terry is a 41 y.o. male.   Patient presents with right lower abdominal pain that started approximately 2 days ago.  Pain is rated 2/10 on pain scale and is a constant pain.  Denies nausea, vomiting, diarrhea.  Last bowel movement was yesterday.  He reports that he typically has bowel movements twice daily.  Denies any blood in stool.  Movement exacerbates pain.  Denies any apparent injury to the area.  Denies any known fevers, chest pain, shortness of breath.  Denies history of similar pain.  Patient does have his appendix.  Denies urinary burning, urinary frequency, penile discharge, testicular pain.   Abdominal Pain  Past Medical History:  Diagnosis Date   Anxiety    Blood pressure elevated without history of HTN    Environmental allergies    H/O alcohol abuse    PTSD (post-traumatic stress disorder)    Sleep apnea     There are no problems to display for this patient.   Past Surgical History:  Procedure Laterality Date   ANKLE SURGERY Right    ELBOW SURGERY     FRACTURE SURGERY     leg with hardware placed       Home Medications    Prior to Admission medications   Medication Sig Start Date End Date Taking? Authorizing Provider  azelastine (ASTELIN) 0.1 % nasal spray Place 2 sprays into both nostrils 2 (two) times daily. 07/16/19   Cathie Hoops, Amy V, PA-C  benzonatate (TESSALON) 200 MG capsule Take 1 capsule (200 mg total) by mouth every 8 (eight) hours. 07/16/19   Cathie Hoops, Amy V, PA-C  cetirizine (ZYRTEC) 10 MG tablet Take 10 mg by mouth daily.    [provider]  fluticasone (FLONASE) 50 MCG/ACT nasal spray Place 2 sprays into both nostrils daily. 07/16/19   Belinda Fisher, PA-C  predniSONE (DELTASONE) 50 MG tablet Take 1 tablet (50 mg total) by mouth daily with breakfast. 10/15/19   Belinda Fisher, PA-C    Family History Family History  Problem Relation Age of Onset   Hypertension Mother    Bipolar disorder Mother    Depression Mother    Hyperlipidemia Mother    Lung cancer Father    Alcoholism Father    Stroke Brother     Social History Social History   Tobacco Use   Smoking status: Every Day    Packs/day: 0.25    Types: Cigarettes   Smokeless tobacco: Never  Substance Use Topics   Alcohol use: Yes    Alcohol/week: 0.0 standard drinks   Drug use: No     Allergies   Patient has no known allergies.   Review of Systems Review of Systems Per HPI  Physical Exam Triage Vital Signs ED Triage Vitals [04/06/21 1526]  Enc Vitals Group     BP 121/74     Pulse Rate 88     Resp 16     Temp 98.1 F (36.7 C)     Temp Source Oral     SpO2 97 %     Weight      Height      Head Circumference      Peak Flow      Pain Score 2     Pain Loc  Pain Edu?      Excl. in GC?    No data found.  Updated Vital Signs BP 121/74 (BP Location: Left Arm)    Pulse 88    Temp 98.1 F (36.7 C) (Oral)    Resp 16    SpO2 97%   Visual Acuity Right Eye Distance:   Left Eye Distance:   Bilateral Distance:    Right Eye Near:   Left Eye Near:    Bilateral Near:     Physical Exam Constitutional:      General: [name] not in acute distress.    Appearance: Normal appearance. [name] not toxic-appearing or diaphoretic.  HENT:     Head: Normocephalic and atraumatic.  Eyes:     Extraocular Movements: Extraocular movements intact.     Conjunctiva/sclera: Conjunctivae normal.  Cardiovascular:     Rate and Rhythm: Normal rate and regular rhythm.     Pulses: Normal pulses.     Heart sounds: Normal heart sounds.  Pulmonary:     Effort: Pulmonary effort is normal. No respiratory distress.     Breath sounds: Normal breath sounds.  Abdominal:     General: Bowel sounds are normal. There is no distension.     Palpations: Abdomen is soft.     Tenderness: There is abdominal  tenderness in the right lower quadrant.       Comments: Tenderness to palpation to right lower abdomen.  Neurological:     General: No focal deficit present.     Mental Status: [name] alert and oriented to person, place, and time. Mental status is at baseline.  Psychiatric:        Mood and Affect: Mood normal.        Behavior: Behavior normal.        Thought Content: Thought content normal.        Judgment: Judgment normal.     UC Treatments / Results  Labs (all labs ordered are listed, but only abnormal results are displayed) Labs Reviewed - No data to display  EKG   Radiology No results found.  Procedures Procedures (including critical care time)  Medications Ordered in UC Medications - No data to display  Initial Impression / Assessment and Plan / UC Course  I have reviewed the triage vital signs and the nursing notes.  Pertinent labs & imaging results that were available during my care of the patient were reviewed by me and considered in my medical decision making (see chart for details).      Differential  diagnoses include constipation, hernia, muscular injury, appendicitis.  Low suspicion for appendicitis but do think that this needs to be ruled out given location of pain and physical exam.  Do not have CT capabilities in urgent care.  Advised patient to go to the hospital for further evaluation and management.  Patient verbalized understanding and was agreeable with plan.  Vital signs stable at discharge.  Agree with patient self transport to the hospital. Final Clinical Impressions(s) / UC Diagnoses   Final diagnoses:  Right lower quadrant pain     Discharge Instructions      Please go to the hospital as soon as you leave urgent care for further evaluation and management.    ED Prescriptions   None    PDMP not reviewed this encounter.   Gustavus Bryant, [place] 04/06/21 1554

## 2021-04-06 NOTE — ED Notes (Signed)
Pt A&OX4 ambulatory at d/c with independent steady gait, NAD. Pt verbalized understanding of d/c instructions and follow up care. ?

## 2021-04-06 NOTE — ED Triage Notes (Signed)
Reports abdominal pain for the past 3 days, RLQ pain, reports pain w/ lifting and holding a box out. Denies N/V/D. UC send over to r/o appendicitis.

## 2021-04-06 NOTE — ED Provider Notes (Signed)
[facility] DEPT Provider Note   CSN: [id number] Arrival date & time: 04/06/21  1600     History  Chief Complaint  Patient presents with   Abdominal Pain    Terrence Terry is a 41 y.o. male.  Presents to ER with concern for right lower quadrant pain.  Has been having pain over the last 3 days, somewhat intermittent, seems to be worse with certain positions.  Does do lots of heavy lifting at work.  Went to urgent care and was advised to come to ER for further evaluation, rule out appendicitis.  No associated nausea vomiting or diarrhea.  Pain is currently mild, aching, nonradiating.  Has not tried any over-the-counter medications for his pain.  Additional history obtained from review of patient's urgent care note.  Sent for rule out appendicitis.  HPI     Home Medications Prior to Admission medications   Medication Sig Start Date End Date Taking? Authorizing Provider  azelastine (ASTELIN) 0.1 % nasal spray Place 2 sprays into both nostrils 2 (two) times daily. 07/16/19   Tasia Catchings, Amy V, PA-C  benzonatate (TESSALON) 200 MG capsule Take 1 capsule (200 mg total) by mouth every 8 (eight) hours. 07/16/19   Tasia Catchings, Amy V, PA-C  cetirizine (ZYRTEC) 10 MG tablet Take 10 mg by mouth daily.    [provider]  fluticasone (FLONASE) 50 MCG/ACT nasal spray Place 2 sprays into both nostrils daily. 07/16/19   Tasia Catchings, Amy V, PA-C  predniSONE (DELTASONE) 50 MG tablet Take 1 tablet (50 mg total) by mouth daily with breakfast. 10/15/19   Ok Edwards, PA-C      Allergies    Patient has no known allergies.    Review of Systems   Review of Systems  Constitutional:  Negative for chills and fever.  HENT:  Negative for ear pain and sore throat.   Eyes:  Negative for pain and visual disturbance.  Respiratory:  Negative for cough and shortness of breath.   Cardiovascular:  Negative for chest pain and palpitations.  Gastrointestinal:  Positive for abdominal pain. Negative for  vomiting.  Genitourinary:  Negative for dysuria and hematuria.  Musculoskeletal:  Negative for arthralgias and back pain.  Skin:  Negative for color change and rash.  Neurological:  Negative for seizures and syncope.  All other systems reviewed and are negative.   Physical Exam Updated Vital Signs BP 121/76    Pulse 83    Temp 97.7 F (36.5 C) (Oral)    Resp 20    Ht 5\' 8"  (1.727 m)    Wt 108.9 kg    SpO2 98%    BMI 36.49 kg/m  Physical Exam Vitals and nursing note reviewed.  Constitutional:      General: [name] not in acute distress.    Appearance: [name] well-developed.  HENT:     Head: Normocephalic and atraumatic.  Eyes:     Conjunctiva/sclera: Conjunctivae normal.  Cardiovascular:     Rate and Rhythm: Normal rate and regular rhythm.     Heart sounds: No murmur heard. Pulmonary:     Effort: Pulmonary effort is normal. No respiratory distress.     Breath sounds: Normal breath sounds.  Abdominal:     General: Bowel sounds are normal.     Palpations: Abdomen is soft.     Tenderness: There is abdominal tenderness in the right lower quadrant. There is no guarding or rebound.  Musculoskeletal:        General: No swelling.  Cervical back: Neck supple.  Skin:    General: Skin is warm and dry.     Capillary Refill: Capillary refill takes less than 2 seconds.  Neurological:     Mental Status: [name] alert.  Psychiatric:        Mood and Affect: Mood normal.    ED Results / Procedures / Treatments   Labs (all labs ordered are listed, but only abnormal results are displayed) Labs Reviewed  CBC WITH DIFFERENTIAL/PLATELET - Abnormal; Notable for the following components:      Result Value   WBC 10.6 (*)    All other components within normal limits  COMPREHENSIVE METABOLIC PANEL - Abnormal; Notable for the following components:   BUN 32 (*)    Calcium 8.6 (*)    All other components within normal limits  URINALYSIS, ROUTINE W REFLEX MICROSCOPIC - Abnormal; Notable for the  following components:   Specific Gravity, Urine >1.046 (*)    All other components within normal limits  LIPASE, BLOOD    EKG None  Radiology CT Abdomen Pelvis W Contrast  Result Date: 04/06/2021 CLINICAL DATA:  Right lower quadrant abdominal pain. EXAM: CT ABDOMEN AND PELVIS WITH CONTRAST TECHNIQUE: Multidetector CT imaging of the abdomen and pelvis was performed using the standard protocol following bolus administration of intravenous contrast. RADIATION DOSE REDUCTION: This exam was performed according to the departmental dose-optimization program which includes automated exposure control, adjustment of the mA and/or kV according to patient size and/or use of iterative reconstruction technique. CONTRAST:  192mL OMNIPAQUE IOHEXOL 300 MG/ML  SOLN COMPARISON:  None. FINDINGS: Lower chest: No acute abnormality. Hepatobiliary: No suspicious hepatic lesion. Gallbladder is unremarkable. No biliary ductal dilation. Pancreas: No pancreatic ductal dilation or evidence of acute inflammation. Spleen: No splenomegaly or suspicious splenic lesion. Adrenals/Urinary Tract: Bilateral adrenal glands appear normal. No hydronephrosis. Symmetric enhancement excretion of contrast from the bilateral kidneys. No solid enhancing renal mass. Urinary bladder is unremarkable for degree of distension. Stomach/Bowel: No enteric contrast was administered. Stomach is minimally distended without wall thickening. Pathologic dilation of small or large bowel. The terminal ileum and terminal ileum appear normal. No evidence of acute bowel inflammation. No suspicious colonic wall thickening or mass like lesions identified. Vascular/Lymphatic: Scattered aortic atherosclerosis without abdominal aortic aneurysm. No pathologically enlarged abdominopelvic lymph nodes. Reproductive: Prostate is unremarkable. Other: No significant abdominopelvic free fluid. No pneumoperitoneum. Musculoskeletal: No acute or significant osseous findings.  IMPRESSION: 1. No acute abnormality in the abdomen or pelvis. Specifically, no evidence of acute appendicitis. 2. Aortic Atherosclerosis (ICD10-I70.0). Electronically Signed   By: Dahlia Bailiff M.D.   On: 04/06/2021 18:42    Procedures Procedures    Medications Ordered in ED Medications  iohexol (OMNIPAQUE) 300 MG/ML solution 100 mL (100 mLs Intravenous Contrast Given 04/06/21 1828)    ED Course/ Medical Decision Making/ A&P                           Medical Decision Making   41 year old gentleman presented to ER with concern for right lower quadrant pain.  On exam well-appearing in no distress.  Abdomen soft.  Did have some tenderness in the right lower quadrant.  Broad differential considered including appendicitis, hernia, diverticulitis, UTI.  Basic lab work is grossly stable.  No leukocytosis or electrolyte derangement.  CT scan negative for acute abdominal pathology.  No appendicitis.  UA negative for infection.  Suspect most likely MSK in nature given the reassuring work-up today.  Pain is relatively minimal at present, given work-up, feel patient can be discharged and does not require admission.  Recommended recheck with primary doctor, reviewed return precautions and discharged.     After the discussed management above, the patient was determined to be safe for discharge.  The patient was in agreement with this plan and all questions regarding their care were answered.  ED return precautions were discussed and the patient will return to the ED with any significant worsening of condition.       Final Clinical Impression(s) / ED Diagnoses Final diagnoses:  Abdominal pain, unspecified abdominal location    Rx / DC Orders ED Discharge Orders     None         Lucrezia Starch, MD 04/07/21 1520

## 2021-04-12 ENCOUNTER — Ambulatory Visit: Payer: BC Managed Care – PPO | Admitting: Family Medicine

## 2021-04-12 ENCOUNTER — Encounter: Payer: Self-pay | Admitting: Family Medicine

## 2021-04-12 ENCOUNTER — Other Ambulatory Visit: Payer: Self-pay

## 2021-04-12 VITALS — BP 123/81 | HR 81 | Temp 98.0°F | Resp 16 | Ht 69.0 in | Wt 248.6 lb

## 2021-04-12 DIAGNOSIS — T148XXA Other injury of unspecified body region, initial encounter: Secondary | ICD-10-CM | POA: Diagnosis not present

## 2021-04-12 MED ORDER — CYCLOBENZAPRINE HCL 5 MG PO TABS: 5.0000 mg | ORAL_TABLET | Freq: Every evening | ORAL | 0 refills | Status: DC | PRN

## 2021-04-12 NOTE — Progress Notes (Signed)
Patient is new to PCP  Patient would see gastroenterologist for stomach issues

## 2021-04-12 NOTE — Progress Notes (Signed)
Established  Patient Office Visit  Subjective:  Patient ID: Terrence Terry, male    DOB: 1980/10/29  Age: 41 y.o. MRN: [id number]  CC:  Chief Complaint  Patient presents with   Establish Care    HPI Terrence Terry presents for complain of rightsided abdominal pain. Was seen in ED and imaging was unremarkable. Patient believes that he may just have pulled a muscle  as he does heavy lifting at work.   Past Medical History:  Diagnosis Date   Anxiety    Blood pressure elevated without history of HTN    Environmental allergies    H/O alcohol abuse    PTSD (post-traumatic stress disorder)    Sleep apnea     Past Surgical History:  Procedure Laterality Date   ANKLE SURGERY Right    ELBOW SURGERY     FRACTURE SURGERY     leg with hardware placed    Family History  Problem Relation Age of Onset   Hypertension Mother    Bipolar disorder Mother    Depression Mother    Hyperlipidemia Mother    Lung cancer Father    Alcoholism Father    Stroke Brother     Social History   Socioeconomic History   Marital status: Married    Spouse name: Not on file   Number of children: Not on file   Years of education: Not on file   Highest education level: Not on file  Occupational History   Not on file  Tobacco Use   Smoking status: Every Day    Packs/day: 0.25    Types: Cigarettes   Smokeless tobacco: Never  Substance and Sexual Activity   Alcohol use: Yes   Drug use: No   Sexual activity: Not on file  Other Topics Concern   Not on file  Social History Narrative   Not on file   Social Determinants of Health   Financial Resource Strain: Not on file  Food Insecurity: Not on file  Transportation Needs: Not on file  Physical Activity: Not on file  Stress: Not on file  Social Connections: Not on file  Intimate Partner Violence: Not on file    ROS Review of Systems  All other systems reviewed and are negative.  Objective:   Today's Vitals: BP 123/81    Pulse 81     Temp 98 F (36.7 C) (Oral)    Resp 16    Ht 5\' 9"  (1.753 m)    Wt 248 lb 9.6 oz (112.8 kg)    SpO2 95%    BMI 36.71 kg/m   Physical Exam Vitals and nursing note reviewed.  Constitutional:      General: [name] not in acute distress. Cardiovascular:     Rate and Rhythm: Normal rate and regular rhythm.  Pulmonary:     Effort: Pulmonary effort is normal.     Breath sounds: Normal breath sounds.  Abdominal:     Palpations: Abdomen is soft.     Tenderness: There is no abdominal tenderness.  Neurological:     General: No focal deficit present.     Mental Status: [name] alert and oriented to person, place, and time.    Assessment & Plan:  1. Muscle strain Improving per patient. Discussed activity options including reduced lifting until symptoms resolved.    Outpatient Encounter Medications as of 04/12/2021  Medication Sig   fluticasone (FLONASE) 50 MCG/ACT nasal spray Place 2 sprays into both nostrils daily.   [DISCONTINUED] azelastine (  ASTELIN) 0.1 % nasal spray Place 2 sprays into both nostrils 2 (two) times daily.   [DISCONTINUED] benzonatate (TESSALON) 200 MG capsule Take 1 capsule (200 mg total) by mouth every 8 (eight) hours.   [DISCONTINUED] cetirizine (ZYRTEC) 10 MG tablet Take 10 mg by mouth daily.   [DISCONTINUED] predniSONE (DELTASONE) 50 MG tablet Take 1 tablet (50 mg total) by mouth daily with breakfast.   No facility-administered encounter medications on file as of 04/12/2021.    Follow-up: No follow-ups on file.   Becky Sax, MD

## 2021-04-13 ENCOUNTER — Encounter: Payer: Self-pay | Admitting: Family Medicine

## 2021-05-19 ENCOUNTER — Other Ambulatory Visit: Payer: Self-pay

## 2021-05-19 ENCOUNTER — Ambulatory Visit (INDEPENDENT_AMBULATORY_CARE_PROVIDER_SITE_OTHER): Payer: BC Managed Care – PPO | Admitting: Family Medicine

## 2021-05-19 ENCOUNTER — Encounter: Payer: Self-pay | Admitting: Family Medicine

## 2021-05-19 VITALS — BP 114/75 | HR 75 | Temp 98.1°F | Resp 16 | Wt 241.6 lb

## 2021-05-19 DIAGNOSIS — J309 Allergic rhinitis, unspecified: Secondary | ICD-10-CM

## 2021-05-19 DIAGNOSIS — R1013 Epigastric pain: Secondary | ICD-10-CM

## 2021-05-19 NOTE — Progress Notes (Signed)
Patient is her for f/u for his congestion and digestion issues. Patient said that he is doing much better today. Patient has no other issues ?

## 2021-05-19 NOTE — Progress Notes (Signed)
? ?Established Patient Office Visit ? ?Subjective:  ?Patient ID: Marchello Rothgeb, male    DOB: [birth year]  Age: 41 y.o. MRN: [id number] ? ?CC:  ?Chief Complaint  ?Patient presents with  ? Follow-up  ?  Congestion, Digestion  ? ? ?HPI ?Chaos Carlile presents for follow up of digestion and congestion. Patient reports that he utilized the flonase for his nasal sx and heeded the dietary recommendations given him last office visit and his sxs have adequately improved.  ? ?Past Medical History:  ?Diagnosis Date  ? Anxiety   ? Blood pressure elevated without history of HTN   ? Environmental allergies   ? H/O alcohol abuse   ? PTSD (post-traumatic stress disorder)   ? Sleep apnea   ? ? ?Past Surgical History:  ?Procedure Laterality Date  ? ANKLE SURGERY Right   ? ELBOW SURGERY    ? FRACTURE SURGERY    ? leg with hardware placed  ? ? ?Family History  ?Problem Relation Age of Onset  ? Hypertension Mother   ? Bipolar disorder Mother   ? Depression Mother   ? Hyperlipidemia Mother   ? Lung cancer Father   ? Alcoholism Father   ? Stroke Brother   ? ? ?Social History  ? ?Socioeconomic History  ? Marital status: Married  ?  Spouse name: Not on file  ? Number of children: Not on file  ? Years of education: Not on file  ? Highest education level: Not on file  ?Occupational History  ? Not on file  ?Tobacco Use  ? Smoking status: Every Day  ?  Packs/day: 0.25  ?  Types: Cigarettes  ? Smokeless tobacco: Never  ?Substance and Sexual Activity  ? Alcohol use: Yes  ? Drug use: No  ? Sexual activity: Not on file  ?Other Topics Concern  ? Not on file  ?Social History Narrative  ? Not on file  ? ?Social Determinants of Health  ? ?Financial Resource Strain: Not on file  ?Food Insecurity: Not on file  ?Transportation Needs: Not on file  ?Physical Activity: Not on file  ?Stress: Not on file  ?Social Connections: Not on file  ?Intimate Partner Violence: Not on file  ? ? ?ROS ?Review of Systems  ?HENT:  Negative for congestion and rhinorrhea.    ?Gastrointestinal:  Negative for abdominal distention and abdominal pain.  ?All other systems reviewed and are negative. ? ?Objective:  ? ?Today's Vitals: BP 114/75   Pulse 75   Temp 98.1 ?F (36.7 ?C) (Oral)   Resp 16   Wt 241 lb 9.6 oz (109.6 kg)   SpO2 96%   BMI 35.68 kg/m?  ? ?Physical Exam ?Vitals and nursing note reviewed.  ?Constitutional:   ?   General: [name] not in acute distress. ?Cardiovascular:  ?   Rate and Rhythm: Normal rate and regular rhythm.  ?Pulmonary:  ?   Effort: Pulmonary effort is normal.  ?   Breath sounds: Normal breath sounds.  ?Abdominal:  ?   Palpations: Abdomen is soft.  ?   Tenderness: There is no abdominal tenderness.  ?Neurological:  ?   General: No focal deficit present.  ?   Mental Status: [name] alert and oriented to person, place, and time.  ? ? ?Assessment & Plan:  ? ?1. Allergic rhinitis, unspecified seasonality, unspecified trigger ?Patient reports adequate improvement with present management. Discussed upcoming allergy season ? ?2. Dyspepsia ?Patient reports adequate improvement with present management.  ? ? ? ?Outpatient Encounter Medications  as of 05/19/2021  ?Medication Sig  ? cyclobenzaprine (FLEXERIL) 5 MG tablet Take 1 tablet (5 mg total) by mouth at bedtime as needed for muscle spasms.  ? fluticasone (FLONASE) 50 MCG/ACT nasal spray Place 2 sprays into both nostrils daily.  ? ?No facility-administered encounter medications on file as of 05/19/2021.  ? ? ?Follow-up: No follow-ups on file.  ? ?Tommie Raymond, MD ? ?

## 2021-11-24 ENCOUNTER — Ambulatory Visit (INDEPENDENT_AMBULATORY_CARE_PROVIDER_SITE_OTHER): Payer: BC Managed Care – PPO | Admitting: Family Medicine

## 2021-11-24 ENCOUNTER — Encounter: Payer: Self-pay | Admitting: Family Medicine

## 2021-11-24 VITALS — BP 118/83 | HR 76 | Temp 97.0°F | Resp 16 | Ht 69.0 in | Wt 251.4 lb

## 2021-11-24 DIAGNOSIS — Z0000 Encounter for general adult medical examination without abnormal findings: Secondary | ICD-10-CM | POA: Diagnosis not present

## 2021-11-24 DIAGNOSIS — M21969 Unspecified acquired deformity of unspecified lower leg: Secondary | ICD-10-CM | POA: Insufficient documentation

## 2021-11-24 DIAGNOSIS — Z789 Other specified health status: Secondary | ICD-10-CM | POA: Insufficient documentation

## 2021-11-24 DIAGNOSIS — M7660 Achilles tendinitis, unspecified leg: Secondary | ICD-10-CM | POA: Insufficient documentation

## 2021-11-24 DIAGNOSIS — F1020 Alcohol dependence, uncomplicated: Secondary | ICD-10-CM | POA: Insufficient documentation

## 2021-11-24 DIAGNOSIS — E669 Obesity, unspecified: Secondary | ICD-10-CM

## 2021-11-24 DIAGNOSIS — Z114 Encounter for screening for human immunodeficiency virus [HIV]: Secondary | ICD-10-CM

## 2021-11-24 DIAGNOSIS — F1010 Alcohol abuse, uncomplicated: Secondary | ICD-10-CM | POA: Insufficient documentation

## 2021-11-24 DIAGNOSIS — Z7689 Persons encountering health services in other specified circumstances: Secondary | ICD-10-CM | POA: Diagnosis not present

## 2021-11-24 DIAGNOSIS — Z13220 Encounter for screening for lipoid disorders: Secondary | ICD-10-CM

## 2021-11-24 DIAGNOSIS — G4710 Hypersomnia, unspecified: Secondary | ICD-10-CM | POA: Insufficient documentation

## 2021-11-24 DIAGNOSIS — E663 Overweight: Secondary | ICD-10-CM | POA: Insufficient documentation

## 2021-11-24 DIAGNOSIS — Z9989 Dependence on other enabling machines and devices: Secondary | ICD-10-CM | POA: Insufficient documentation

## 2021-11-24 DIAGNOSIS — H5210 Myopia, unspecified eye: Secondary | ICD-10-CM | POA: Insufficient documentation

## 2021-11-24 DIAGNOSIS — Z130 Encounter for screening for diseases of the blood and blood-forming organs and certain disorders involving the immune mechanism: Secondary | ICD-10-CM

## 2021-11-24 DIAGNOSIS — F4312 Post-traumatic stress disorder, chronic: Secondary | ICD-10-CM | POA: Insufficient documentation

## 2021-11-24 DIAGNOSIS — Z87820 Personal history of traumatic brain injury: Secondary | ICD-10-CM | POA: Insufficient documentation

## 2021-11-24 DIAGNOSIS — F17200 Nicotine dependence, unspecified, uncomplicated: Secondary | ICD-10-CM | POA: Insufficient documentation

## 2021-11-24 DIAGNOSIS — Z6837 Body mass index (BMI) 37.0-37.9, adult: Secondary | ICD-10-CM

## 2021-11-24 DIAGNOSIS — Z0289 Encounter for other administrative examinations: Secondary | ICD-10-CM | POA: Insufficient documentation

## 2021-11-24 DIAGNOSIS — Z1159 Encounter for screening for other viral diseases: Secondary | ICD-10-CM

## 2021-11-24 NOTE — Progress Notes (Signed)
Established Patient Office Visit  Subjective    Patient ID: Terrence Terry, male    DOB: [birth year]  Age: 41 y.o. MRN: [id number]  CC: No chief complaint on file.   HPI Terrence Terry presents for routine annual exam. Patient denies acute complaints or concerns.    Outpatient Encounter Medications as of 11/24/2021  Medication Sig   fluticasone (FLONASE) 50 MCG/ACT nasal spray Place 2 sprays into both nostrils daily.   benzonatate (TESSALON) 100 MG capsule  (Patient not taking: Reported on 11/24/2021)   cyclobenzaprine (FLEXERIL) 5 MG tablet Take 1 tablet (5 mg total) by mouth at bedtime as needed for muscle spasms. (Patient not taking: Reported on 11/24/2021)   doxycycline (VIBRA-TABS) 100 MG tablet  (Patient not taking: Reported on 11/24/2021)   No facility-administered encounter medications on file as of 11/24/2021.    Past Medical History:  Diagnosis Date   Anxiety    Blood pressure elevated without history of HTN    Environmental allergies    H/O alcohol abuse    PTSD (post-traumatic stress disorder)    Sleep apnea     Past Surgical History:  Procedure Laterality Date   ANKLE SURGERY Right    ELBOW SURGERY     FRACTURE SURGERY     leg with hardware placed    Family History  Problem Relation Age of Onset   Hypertension Mother    Bipolar disorder Mother    Depression Mother    Hyperlipidemia Mother    Arthritis Mother    Hearing loss Mother    Lung cancer Father    Alcoholism Father    Alcohol abuse Father    Arthritis Father    COPD Father    Stroke Brother    Drug abuse Brother    Asthma Sister    Intellectual disability Maternal Uncle    Intellectual disability Paternal Uncle     Social History   Socioeconomic History   Marital status: Married    Spouse name: Not on file   Number of children: Not on file   Years of education: Not on file   Highest education level: Not on file  Occupational History   Not on file  Tobacco Use   Smoking status: Every Day     Packs/day: 0.25    Types: Cigarettes   Smokeless tobacco: Never  Substance and Sexual Activity   Alcohol use: Yes    Alcohol/week: 12.0 standard drinks of alcohol    Types: 12 Cans of beer per week   Drug use: No   Sexual activity: Yes    Birth control/protection: None  Other Topics Concern   Not on file  Social History Narrative   Not on file   Social Determinants of Health   Financial Resource Strain: Not on file  Food Insecurity: Not on file  Transportation Needs: Not on file  Physical Activity: Not on file  Stress: Not on file  Social Connections: Not on file  Intimate Partner Violence: Not on file    Review of Systems  All other systems reviewed and are negative.       Objective    BP 118/83   Pulse 76   Temp (!) 97 F (36.1 C) (Oral)   Resp 16   Ht _0  (1.753 m)   Wt 251 lb 6.4 oz (114 kg)   SpO2 98%   BMI 37.13 kg/m   Physical Exam Vitals and nursing note reviewed.  Constitutional:      General:  [name] not in acute distress.    Appearance: [name] obese.  HENT:     Head: Normocephalic and atraumatic.     Right Ear: Tympanic membrane, ear canal and external ear normal.     Left Ear: Tympanic membrane, ear canal and external ear normal.     Nose: Nose normal.     Mouth/Throat:     Mouth: Mucous membranes are moist.     Pharynx: Oropharynx is clear.  Eyes:     Conjunctiva/sclera: Conjunctivae normal.     Pupils: Pupils are equal, round, and reactive to light.  Neck:     Thyroid: No thyromegaly.  Cardiovascular:     Rate and Rhythm: Normal rate and regular rhythm.     Heart sounds: Normal heart sounds. No murmur heard. Pulmonary:     Effort: Pulmonary effort is normal.     Breath sounds: Normal breath sounds.  Abdominal:     General: There is no distension.     Palpations: Abdomen is soft. There is no mass.     Tenderness: There is no abdominal tenderness.     Hernia: There is no hernia in the left inguinal area or right inguinal area.   Genitourinary:    Penis: Normal and circumcised.      Testes: Normal.  Musculoskeletal:        General: Normal range of motion.     Cervical back: Normal range of motion and neck supple.     Right lower leg: No edema.     Left lower leg: No edema.  Skin:    General: Skin is warm and dry.  Neurological:     General: No focal deficit present.     Mental Status: [name] alert and oriented to person, place, and time. Mental status is at baseline.  Psychiatric:        Mood and Affect: Mood normal.        Behavior: Behavior normal.         Assessment & Plan:   1. Annual physical exam  - CMP14+EGFR  2. Screening for deficiency anemia  - CBC with Differential  3. Screening for lipid disorders  - Lipid Panel  4. Screening for endocrine/metabolic/immunity disorders  - TSH - Hemoglobin A1c  5. Screening for HIV (human immunodeficiency virus)  - HIV antibody (with reflex)  6. Need for hepatitis C screening test  - Hepatitis C Antibody    Return in about 1 year (around 11/25/2022) for physical.   Becky Sax, MD

## 2021-11-25 LAB — CBC WITH DIFFERENTIAL/PLATELET
Basophils Absolute: 0.1 10*3/uL (ref 0.0–0.2)
Basos: 1 %
EOS (ABSOLUTE): 0.2 10*3/uL (ref 0.0–0.4)
Eos: 3 %
Hematocrit: 45.5 % (ref 37.5–51.0)
Hemoglobin: 15.5 g/dL (ref 13.0–17.7)
Immature Grans (Abs): 0 10*3/uL (ref 0.0–0.1)
Immature Granulocytes: 1 %
Lymphocytes Absolute: 2.1 10*3/uL (ref 0.7–3.1)
Lymphs: 26 %
MCH: 32 pg (ref 26.6–33.0)
MCHC: 34.1 g/dL (ref 31.5–35.7)
MCV: 94 fL (ref 79–97)
Monocytes Absolute: 0.5 10*3/uL (ref 0.1–0.9)
Monocytes: 6 %
Neutrophils Absolute: 5 10*3/uL (ref 1.4–7.0)
Neutrophils: 63 %
Platelets: 265 10*3/uL (ref 150–450)
RBC: 4.84 x10E6/uL (ref 4.14–5.80)
RDW: 12.5 % (ref 11.6–15.4)
WBC: 8 10*3/uL (ref 3.4–10.8)

## 2021-11-25 LAB — CMP14+EGFR
ALT: 29 IU/L (ref 0–44)
AST: 25 IU/L (ref 0–40)
Albumin/Globulin Ratio: 1.9 (ref 1.2–2.2)
Albumin: 4.5 g/dL (ref 4.1–5.1)
Alkaline Phosphatase: 100 IU/L (ref 44–121)
BUN/Creatinine Ratio: 22 — ABNORMAL HIGH (ref 9–20)
BUN: 18 mg/dL (ref 6–24)
Bilirubin Total: 0.6 mg/dL (ref 0.0–1.2)
CO2: 20 mmol/L (ref 20–29)
Calcium: 9.3 mg/dL (ref 8.7–10.2)
Chloride: 102 mmol/L (ref 96–106)
Creatinine, Ser: 0.83 mg/dL (ref 0.76–1.27)
Globulin, Total: 2.4 g/dL (ref 1.5–4.5)
Glucose: 94 mg/dL (ref 70–99)
Potassium: 4.4 mmol/L (ref 3.5–5.2)
Sodium: 139 mmol/L (ref 134–144)
Total Protein: 6.9 g/dL (ref 6.0–8.5)
eGFR: 112 mL/min/{1.73_m2} (ref >59)

## 2021-11-25 LAB — HIV ANTIBODY (ROUTINE TESTING W REFLEX): HIV Screen 4th Generation wRfx: NONREACTIVE

## 2021-11-25 LAB — HEMOGLOBIN A1C
Est. average glucose Bld gHb Est-mCnc: 108 mg/dL
Hgb A1c MFr Bld: 5.4 % (ref 4.8–5.6)

## 2021-11-25 LAB — LIPID PANEL
Chol/HDL Ratio: 3.9 ratio (ref 0.0–5.0)
Cholesterol, Total: 181 mg/dL (ref 100–199)
HDL: 46 mg/dL (ref >39)
LDL Chol Calc (NIH): 103 mg/dL — ABNORMAL HIGH (ref 0–99)
Triglycerides: 185 mg/dL — ABNORMAL HIGH (ref 0–149)
VLDL Cholesterol Cal: 32 mg/dL (ref 5–40)

## 2021-11-25 LAB — TSH: TSH: 3.95 u[IU]/mL (ref 0.450–4.500)

## 2021-11-25 LAB — HEPATITIS C ANTIBODY: Hep C Virus Ab: NONREACTIVE

## 2021-12-01 ENCOUNTER — Ambulatory Visit: Payer: BC Managed Care – PPO | Admitting: Family Medicine

## 2021-12-01 ENCOUNTER — Encounter: Payer: Self-pay | Admitting: Family Medicine

## 2021-12-01 VITALS — BP 115/78 | HR 78 | Temp 97.7°F | Resp 16 | Wt 255.0 lb

## 2021-12-01 DIAGNOSIS — K219 Gastro-esophageal reflux disease without esophagitis: Secondary | ICD-10-CM | POA: Diagnosis not present

## 2021-12-01 MED ORDER — OMEPRAZOLE 40 MG PO CPDR: 40.0000 mg | DELAYED_RELEASE_CAPSULE | Freq: Every day | ORAL | 1 refills | Status: DC

## 2021-12-01 NOTE — Progress Notes (Unsigned)
Patient is here c/o heart burn that he has had for years. Patient would like to talk with provider about .  Patient would like to go over recent labs that were drawn

## 2021-12-02 ENCOUNTER — Encounter: Payer: Self-pay | Admitting: Family Medicine

## 2021-12-02 NOTE — Progress Notes (Signed)
Established Patient Office Visit  Subjective    Patient ID: Terrence Terry, male    DOB: November 26, 1980  Age: 41 y.o. MRN: [id number]  CC: No chief complaint on file.   HPI Terrence Terry presents with complaint of persistent heartburn for years but worsening recently. He has taken no meds for sx.    Outpatient Encounter Medications as of 12/01/2021  Medication Sig   omeprazole (PRILOSEC) 40 MG capsule Take 1 capsule (40 mg total) by mouth daily.   benzonatate (TESSALON) 100 MG capsule  (Patient not taking: Reported on 11/24/2021)   doxycycline (VIBRA-TABS) 100 MG tablet  (Patient not taking: Reported on 11/24/2021)   fluticasone (FLONASE) 50 MCG/ACT nasal spray Place 2 sprays into both nostrils daily.   No facility-administered encounter medications on file as of 12/01/2021.    Past Medical History:  Diagnosis Date   Anxiety    Blood pressure elevated without history of HTN    Environmental allergies    H/O alcohol abuse    PTSD (post-traumatic stress disorder)    Sleep apnea     Past Surgical History:  Procedure Laterality Date   ANKLE SURGERY Right    ELBOW SURGERY     FRACTURE SURGERY     leg with hardware placed    Family History  Problem Relation Age of Onset   Hypertension Mother    Bipolar disorder Mother    Depression Mother    Hyperlipidemia Mother    Arthritis Mother    Hearing loss Mother    Lung cancer Father    Alcoholism Father    Alcohol abuse Father    Arthritis Father    COPD Father    Stroke Brother    Drug abuse Brother    Asthma Sister    Intellectual disability Maternal Uncle    Intellectual disability Paternal Uncle     Social History   Socioeconomic History   Marital status: Married    Spouse name: Not on file   Number of children: Not on file   Years of education: Not on file   Highest education level: Not on file  Occupational History   Not on file  Tobacco Use   Smoking status: Every Day    Packs/day: 0.25    Types: Cigarettes    Smokeless tobacco: Never  Substance and Sexual Activity   Alcohol use: Yes    Alcohol/week: 12.0 standard drinks of alcohol    Types: 12 Cans of beer per week   Drug use: No   Sexual activity: Yes    Birth control/protection: None  Other Topics Concern   Not on file  Social History Narrative   Not on file   Social Determinants of Health   Financial Resource Strain: Not on file  Food Insecurity: Not on file  Transportation Needs: Not on file  Physical Activity: Not on file  Stress: Not on file  Social Connections: Not on file  Intimate Partner Violence: Not on file    Review of Systems  Cardiovascular:  Negative for chest pain.  Gastrointestinal:  Positive for heartburn.  All other systems reviewed and are negative.       Objective    BP 115/78   Pulse 78   Temp 97.7 F (36.5 C) (Oral)   Resp 16   Wt 255 lb (115.7 kg)   SpO2 97%   BMI 37.66 kg/m   Physical Exam Vitals and nursing note reviewed.  Constitutional:      General: [name]  not in acute distress. Cardiovascular:     Rate and Rhythm: Normal rate and regular rhythm.  Pulmonary:     Effort: Pulmonary effort is normal.     Breath sounds: Normal breath sounds.  Abdominal:     Palpations: Abdomen is soft.     Tenderness: There is no abdominal tenderness.  Neurological:     General: No focal deficit present.     Mental Status: [name] alert and oriented to person, place, and time.         Assessment & Plan:   1. Gastroesophageal reflux disease without esophagitis Discussed dietary and activity options. Omeprazole prescribed. monitor    Return in about 3 months (around 03/02/2022) for follow up.   Tommie Raymond, MD

## 2022-03-22 ENCOUNTER — Ambulatory Visit (INDEPENDENT_AMBULATORY_CARE_PROVIDER_SITE_OTHER): Payer: BC Managed Care – PPO | Admitting: Family Medicine

## 2022-03-22 ENCOUNTER — Encounter: Payer: Self-pay | Admitting: Family Medicine

## 2022-03-22 VITALS — BP 125/83 | HR 90 | Temp 97.6°F | Resp 16 | Wt 261.6 lb

## 2022-03-22 DIAGNOSIS — Z23 Encounter for immunization: Secondary | ICD-10-CM

## 2022-03-22 DIAGNOSIS — E663 Overweight: Secondary | ICD-10-CM | POA: Diagnosis not present

## 2022-03-22 DIAGNOSIS — K219 Gastro-esophageal reflux disease without esophagitis: Secondary | ICD-10-CM

## 2022-03-22 DIAGNOSIS — F17210 Nicotine dependence, cigarettes, uncomplicated: Secondary | ICD-10-CM | POA: Diagnosis not present

## 2022-03-22 DIAGNOSIS — Z6838 Body mass index (BMI) 38.0-38.9, adult: Secondary | ICD-10-CM | POA: Diagnosis not present

## 2022-03-22 MED ORDER — OMEPRAZOLE 40 MG PO CPDR: 40.0000 mg | DELAYED_RELEASE_CAPSULE | Freq: Every day | ORAL | 1 refills | Status: DC

## 2022-03-22 NOTE — Progress Notes (Signed)
Patient is here for their 4 month follow-up Patient has no concerns today Care gaps have been discussed with patient  TDAP given today

## 2022-03-22 NOTE — Progress Notes (Signed)
Established Patient Office Visit  Subjective    Patient ID: Terrence Terry, male    DOB: 06/19/1980  Age: 42 y.o. MRN: [id number]  CC: No chief complaint on file.   HPI Terrence Terry presents for routine follow up of chronic med issues. Patient denies acute complaints or concerns.    Outpatient Encounter Medications as of 03/22/2022  Medication Sig   fluticasone (FLONASE) 50 MCG/ACT nasal spray Place 2 sprays into both nostrils daily.   [DISCONTINUED] omeprazole (PRILOSEC) 40 MG capsule Take 1 capsule (40 mg total) by mouth daily.   benzonatate (TESSALON) 100 MG capsule  (Patient not taking: Reported on 11/24/2021)   doxycycline (VIBRA-TABS) 100 MG tablet  (Patient not taking: Reported on 11/24/2021)   omeprazole (PRILOSEC) 40 MG capsule Take 1 capsule (40 mg total) by mouth daily.   No facility-administered encounter medications on file as of 03/22/2022.    Past Medical History:  Diagnosis Date   Anxiety    Blood pressure elevated without history of HTN    Environmental allergies    H/O alcohol abuse    PTSD (post-traumatic stress disorder)    Sleep apnea     Past Surgical History:  Procedure Laterality Date   ANKLE SURGERY Right    ELBOW SURGERY     FRACTURE SURGERY     leg with hardware placed    Family History  Problem Relation Age of Onset   Hypertension Mother    Bipolar disorder Mother    Depression Mother    Hyperlipidemia Mother    Arthritis Mother    Hearing loss Mother    Lung cancer Father    Alcoholism Father    Alcohol abuse Father    Arthritis Father    COPD Father    Stroke Brother    Drug abuse Brother    Asthma Sister    Intellectual disability Maternal Uncle    Intellectual disability Paternal Uncle     Social History   Socioeconomic History   Marital status: Married    Spouse name: Not on file   Number of children: Not on file   Years of education: Not on file   Highest education level: Not on file  Occupational History   Not on file   Tobacco Use   Smoking status: Every Day    Packs/day: 0.25    Types: Cigarettes   Smokeless tobacco: Never  Substance and Sexual Activity   Alcohol use: Yes    Alcohol/week: 12.0 standard drinks of alcohol    Types: 12 Cans of beer per week   Drug use: No   Sexual activity: Yes    Birth control/protection: None  Other Topics Concern   Not on file  Social History Narrative   Not on file   Social Determinants of Health   Financial Resource Strain: Not on file  Food Insecurity: Not on file  Transportation Needs: Not on file  Physical Activity: Not on file  Stress: Not on file  Social Connections: Not on file  Intimate Partner Violence: Not on file    Review of Systems  All other systems reviewed and are negative.       Objective    BP 125/83   Pulse 90   Temp 97.6 F (36.4 C) (Oral)   Resp 16   Wt 261 lb 9.6 oz (118.7 kg)   SpO2 96%   BMI 38.63 kg/m   Physical Exam Vitals and nursing note reviewed.  Constitutional:  General: [name] not in acute distress.    Appearance: [name] obese.  Cardiovascular:     Rate and Rhythm: Normal rate and regular rhythm.  Pulmonary:     Effort: Pulmonary effort is normal.     Breath sounds: Normal breath sounds.  Abdominal:     Palpations: Abdomen is soft.     Tenderness: There is no abdominal tenderness.  Neurological:     General: No focal deficit present.     Mental Status: [name] alert and oriented to person, place, and time.         Assessment & Plan:   1. Gastroesophageal reflux disease without esophagitis Discussed dietary and activity options. continue  2. Overweight As above. Goal is 2-4lbs/mo wt loss  3. Need for Tdap vaccination  - Tdap vaccine greater than or equal to 7yo IM    Return for physical.   Becky Sax, MD

## 2022-11-26 ENCOUNTER — Encounter: Payer: Self-pay | Admitting: Family Medicine

## 2022-11-26 ENCOUNTER — Ambulatory Visit (INDEPENDENT_AMBULATORY_CARE_PROVIDER_SITE_OTHER): Payer: BC Managed Care – PPO | Admitting: Family Medicine

## 2022-11-26 VITALS — BP 120/85 | HR 77 | Temp 98.4°F | Resp 18 | Ht 69.0 in | Wt 261.8 lb

## 2022-11-26 DIAGNOSIS — Z0000 Encounter for general adult medical examination without abnormal findings: Secondary | ICD-10-CM | POA: Diagnosis not present

## 2022-11-26 DIAGNOSIS — Z130 Encounter for screening for diseases of the blood and blood-forming organs and certain disorders involving the immune mechanism: Secondary | ICD-10-CM

## 2022-11-26 DIAGNOSIS — Z13220 Encounter for screening for lipoid disorders: Secondary | ICD-10-CM

## 2022-11-26 NOTE — Progress Notes (Signed)
Annual Exam

## 2022-11-26 NOTE — Progress Notes (Signed)
Established Patient Office Visit  Subjective    Patient ID: Terrence Terry, male    DOB: 11/12/1980  Age: 42 y.o. MRN: [id number]  CC:  Chief Complaint  Patient presents with   Annual Exam    HPI Terrence Terry presents for routine annual exam. Patient denies acute complaints or concerns.   Outpatient Encounter Medications as of 11/26/2022  Medication Sig   omeprazole (PRILOSEC) 40 MG capsule Take 1 capsule (40 mg total) by mouth daily.   benzonatate (TESSALON) 100 MG capsule  (Patient not taking: Reported on 11/26/2022)   doxycycline (VIBRA-TABS) 100 MG tablet  (Patient not taking: Reported on 11/26/2022)   fluticasone (FLONASE) 50 MCG/ACT nasal spray Place 2 sprays into both nostrils daily. (Patient not taking: Reported on 11/26/2022)   No facility-administered encounter medications on file as of 11/26/2022.    Past Medical History:  Diagnosis Date   Anxiety    Blood pressure elevated without history of HTN    Environmental allergies    H/O alcohol abuse    PTSD (post-traumatic stress disorder)    Sleep apnea     Past Surgical History:  Procedure Laterality Date   ANKLE SURGERY Right    ELBOW SURGERY     FRACTURE SURGERY     leg with hardware placed    Family History  Problem Relation Age of Onset   Hypertension Mother    Bipolar disorder Mother    Depression Mother    Hyperlipidemia Mother    Arthritis Mother    Hearing loss Mother    Lung cancer Father    Alcoholism Father    Alcohol abuse Father    Arthritis Father    COPD Father    Stroke Brother    Drug abuse Brother    Asthma Sister    Intellectual disability Maternal Uncle    Intellectual disability Paternal Uncle     Social History   Socioeconomic History   Marital status: Married    Spouse name: Not on file   Number of children: Not on file   Years of education: Not on file   Highest education level: Not on file  Occupational History   Not on file  Tobacco Use   Smoking status: Every Day     Current packs/day: 0.25    Types: Cigarettes   Smokeless tobacco: Never  Substance and Sexual Activity   Alcohol use: Yes    Alcohol/week: 12.0 standard drinks of alcohol    Types: 12 Cans of beer per week   Drug use: No   Sexual activity: Yes    Birth control/protection: None  Other Topics Concern   Not on file  Social History Narrative   Not on file   Social Determinants of Health   Financial Resource Strain: Low Risk  (11/26/2022)   Overall Financial Resource Strain (CARDIA)    Difficulty of Paying Living Expenses: Not hard at all  Food Insecurity: No Food Insecurity (11/26/2022)   Hunger Vital Sign    Worried About Running Out of Food in the Last Year: Never true    Ran Out of Food in the Last Year: Never true  Transportation Needs: No Transportation Needs (11/26/2022)   PRAPARE - Administrator, Civil Service (Medical): No    Lack of Transportation (Non-Medical): No  Physical Activity: Sufficiently Active (11/26/2022)   Exercise Vital Sign    Days of Exercise per Week: 7 days    Minutes of Exercise per Session: 30 min  Stress: No Stress Concern Present (11/26/2022)   [employer] of Occupational Health - Occupational Stress Questionnaire    Feeling of Stress : Not at all  Social Connections: Moderately Isolated (11/26/2022)   Social Connection and Isolation Panel [NHANES]    Frequency of Communication with Friends and Family: More than three times a week    Frequency of Social Gatherings with Friends and Family: More than three times a week    Attends Religious Services: Never    Database administrator or Organizations: No    Attends Banker Meetings: Never    Marital Status: Married  Catering manager Violence: Not At Risk (11/26/2022)   Humiliation, Afraid, Rape, and Kick questionnaire    Fear of Current or Ex-Partner: No    Emotionally Abused: No    Physically Abused: No    Sexually Abused: No    Review of Systems  All other systems reviewed  and are negative.       Objective    BP 120/85 (BP Location: Right Arm, Patient Position: Sitting, Cuff Size: Large)   Pulse 77   Temp 98.4 F (36.9 C) (Oral)   Resp 18   Ht 5\' 9"  (1.753 m)   Wt 261 lb 12.8 oz (118.8 kg)   SpO2 97%   BMI 38.66 kg/m   Physical Exam Vitals and nursing note reviewed.  Constitutional:      General: [name] not in acute distress. HENT:     Head: Normocephalic and atraumatic.     Right Ear: Tympanic membrane, ear canal and external ear normal.     Left Ear: Tympanic membrane, ear canal and external ear normal.     Nose: Nose normal.     Mouth/Throat:     Mouth: Mucous membranes are moist.     Pharynx: Oropharynx is clear.  Eyes:     Conjunctiva/sclera: Conjunctivae normal.     Pupils: Pupils are equal, round, and reactive to light.  Neck:     Thyroid: No thyromegaly.  Cardiovascular:     Rate and Rhythm: Normal rate and regular rhythm.     Heart sounds: Normal heart sounds. No murmur heard. Pulmonary:     Effort: Pulmonary effort is normal.     Breath sounds: Normal breath sounds.  Abdominal:     General: There is no distension.     Palpations: Abdomen is soft. There is no mass.     Tenderness: There is no abdominal tenderness.     Hernia: There is no hernia in the left inguinal area or right inguinal area.  Genitourinary:    Penis: Normal and circumcised.      Testes: Normal.  Musculoskeletal:        General: Normal range of motion.     Cervical back: Normal range of motion and neck supple.     Right lower leg: No edema.     Left lower leg: No edema.  Skin:    General: Skin is warm and dry.  Neurological:     General: No focal deficit present.     Mental Status: [name] alert and oriented to person, place, and time. Mental status is at baseline.  Psychiatric:        Mood and Affect: Mood normal.        Behavior: Behavior normal.         Assessment & Plan:   Annual physical exam -     Comprehensive metabolic  panel  Screening for deficiency  anemia -     CBC with Differential/Platelet  Screening for lipid disorders -     Lipid panel     No follow-ups on file.   Tommie Raymond, MD

## 2022-11-27 LAB — CBC WITH DIFFERENTIAL/PLATELET
Basophils Absolute: 0.1 10*3/uL (ref 0.0–0.2)
Basos: 1 %
EOS (ABSOLUTE): 0.3 10*3/uL (ref 0.0–0.4)
Eos: 4 %
Hematocrit: 45.6 % (ref 37.5–51.0)
Hemoglobin: 14.8 g/dL (ref 13.0–17.7)
Immature Grans (Abs): 0 10*3/uL (ref 0.0–0.1)
Immature Granulocytes: 0 %
Lymphocytes Absolute: 2.1 10*3/uL (ref 0.7–3.1)
Lymphs: 26 %
MCH: 30.1 pg (ref 26.6–33.0)
MCHC: 32.5 g/dL (ref 31.5–35.7)
MCV: 93 fL (ref 79–97)
Monocytes Absolute: 0.5 10*3/uL (ref 0.1–0.9)
Monocytes: 6 %
Neutrophils Absolute: 5 10*3/uL (ref 1.4–7.0)
Neutrophils: 63 %
Platelets: 270 10*3/uL (ref 150–450)
RBC: 4.92 x10E6/uL (ref 4.14–5.80)
RDW: 12.4 % (ref 11.6–15.4)
WBC: 8 10*3/uL (ref 3.4–10.8)

## 2022-11-27 LAB — LIPID PANEL
Chol/HDL Ratio: 5.4 ratio — ABNORMAL HIGH (ref 0.0–5.0)
Cholesterol, Total: 209 mg/dL — ABNORMAL HIGH (ref 100–199)
HDL: 39 mg/dL — ABNORMAL LOW (ref >39)
LDL Chol Calc (NIH): 123 mg/dL — ABNORMAL HIGH (ref 0–99)
Triglycerides: 269 mg/dL — ABNORMAL HIGH (ref 0–149)
VLDL Cholesterol Cal: 47 mg/dL — ABNORMAL HIGH (ref 5–40)

## 2022-11-27 LAB — COMPREHENSIVE METABOLIC PANEL
ALT: 33 IU/L (ref 0–44)
AST: 22 IU/L (ref 0–40)
Albumin: 4.4 g/dL (ref 4.1–5.1)
Alkaline Phosphatase: 110 IU/L (ref 44–121)
BUN/Creatinine Ratio: 18 (ref 9–20)
BUN: 17 mg/dL (ref 6–24)
Bilirubin Total: 0.4 mg/dL (ref 0.0–1.2)
CO2: 24 mmol/L (ref 20–29)
Calcium: 9 mg/dL (ref 8.7–10.2)
Chloride: 104 mmol/L (ref 96–106)
Creatinine, Ser: 0.97 mg/dL (ref 0.76–1.27)
Globulin, Total: 2.4 g/dL (ref 1.5–4.5)
Glucose: 83 mg/dL (ref 70–99)
Potassium: 5 mmol/L (ref 3.5–5.2)
Sodium: 144 mmol/L (ref 134–144)
Total Protein: 6.8 g/dL (ref 6.0–8.5)
eGFR: 99 mL/min/{1.73_m2} (ref >59)

## 2023-01-29 IMAGING — CT CT ABD-PELV W/ CM
2 of 5 series · 15 of 46 positions shown, 17 images · IV contrast (agent unspecified)
Comparison: None.

CLINICAL DATA: Right lower quadrant abdominal pain.

EXAM:
CT ABDOMEN AND PELVIS WITH CONTRAST
TECHNIQUE: Multidetector CT imaging of the abdomen and pelvis was performed
using the standard protocol following bolus administration of
intravenous contrast.

[Series 2: axial st · axial · 0.83mm/px · z∈[-482,-42]mm · 12 of 104 slices shown, 14 images]
[im 8/104  soft-tissue]
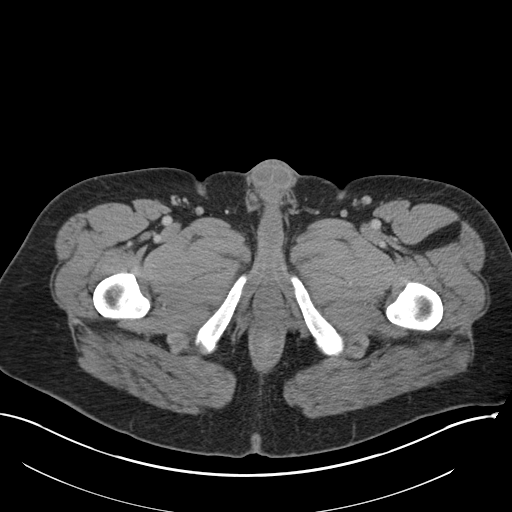
[im 8/104  bone]
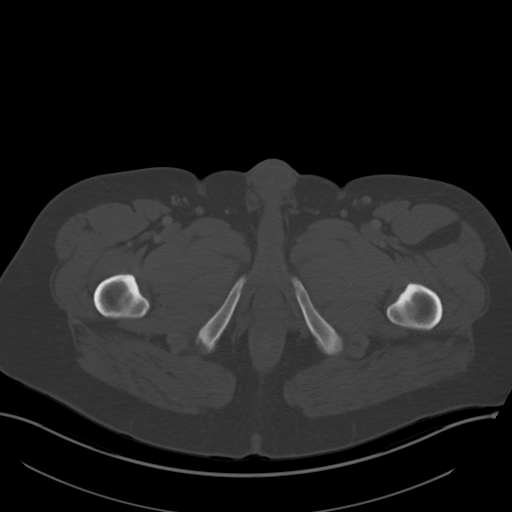
[im 16/104  soft-tissue]
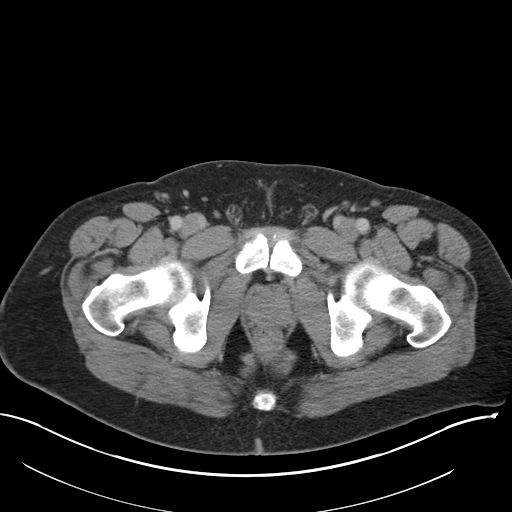
[im 24/104  soft-tissue]
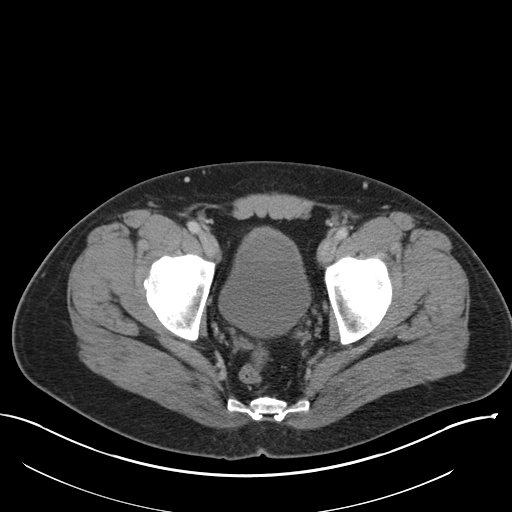
[im 32/104  soft-tissue]
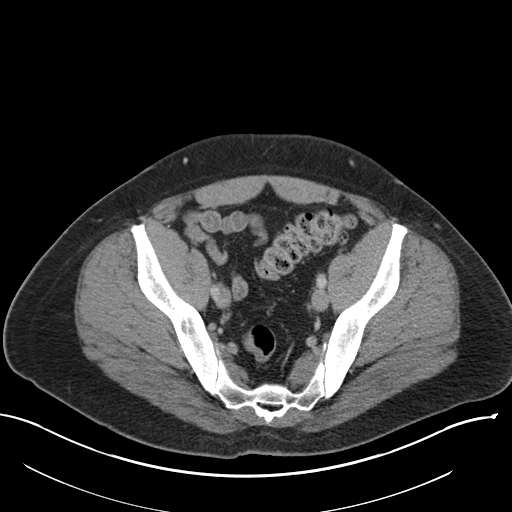
[im 40/104  soft-tissue]
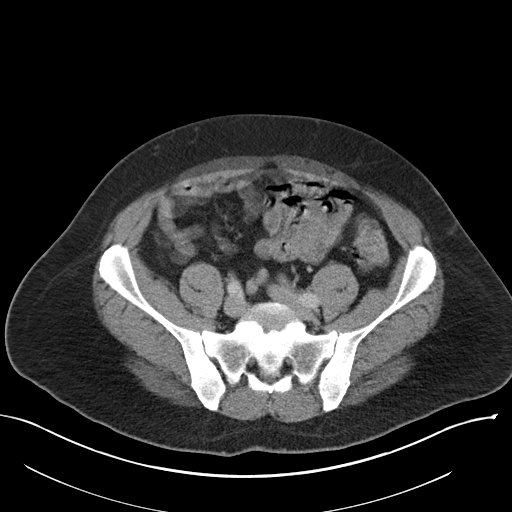
[im 48/104  soft-tissue]
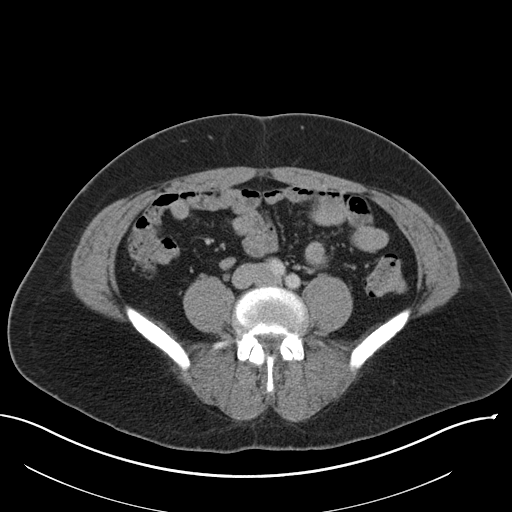
[im 56/104  soft-tissue]
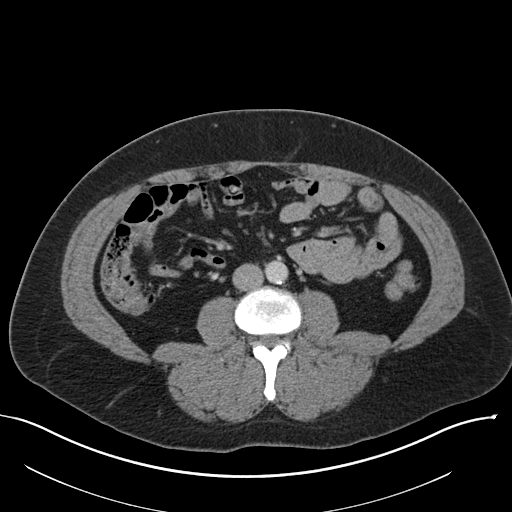
[im 64/104  soft-tissue]
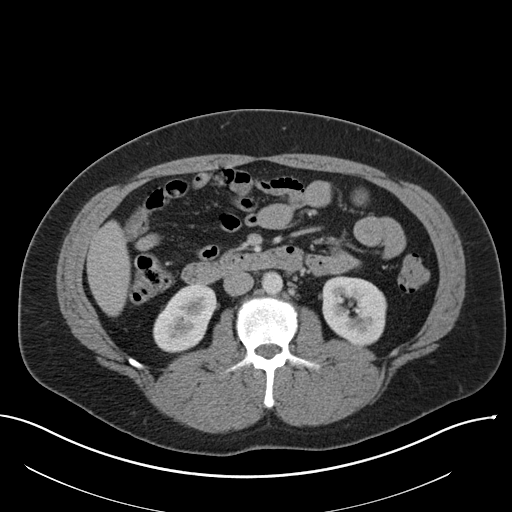
[im 72/104  soft-tissue]
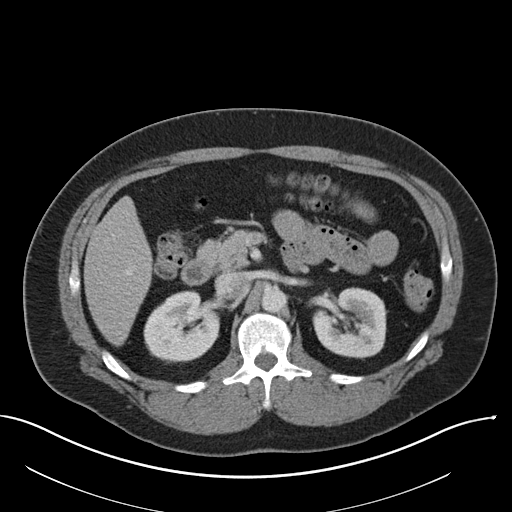
[im 72/104  bone]
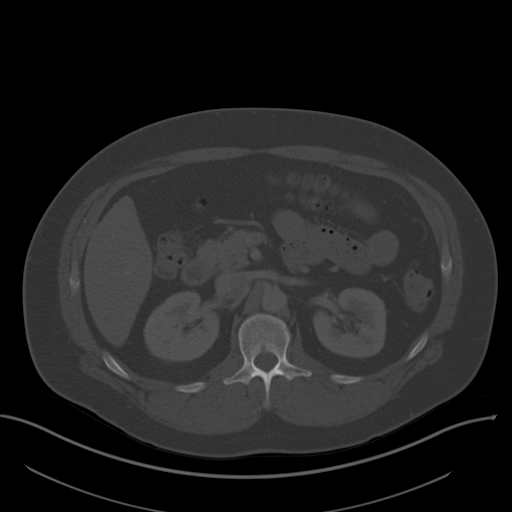
[im 80/104  soft-tissue]
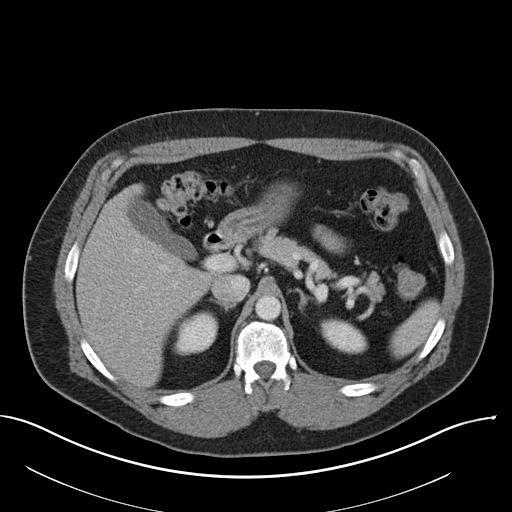
[im 88/104  soft-tissue]
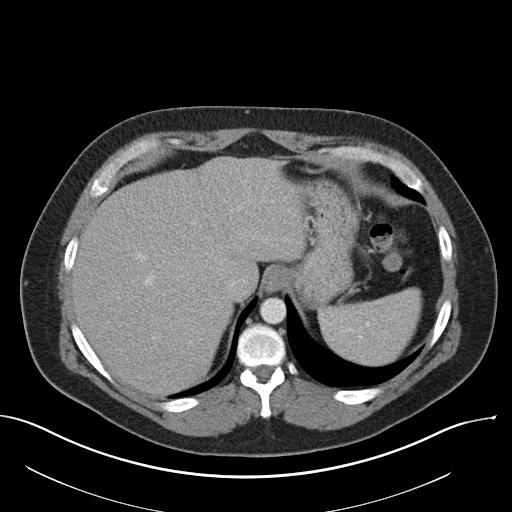
[im 96/104  soft-tissue]
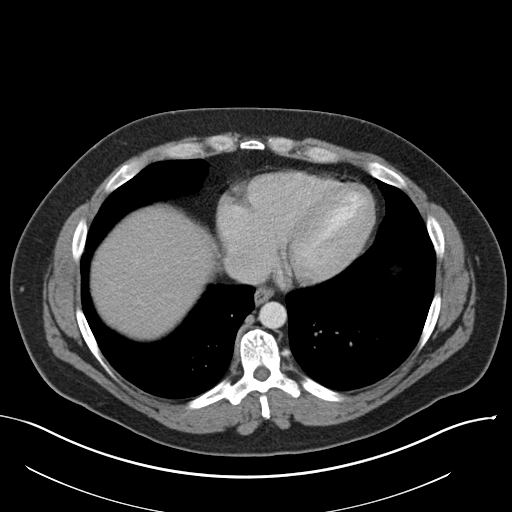

[Series 5: coronal st · coronal · 1.00mm/px · 3 of 164 slices shown]
[im 55/164  soft-tissue]
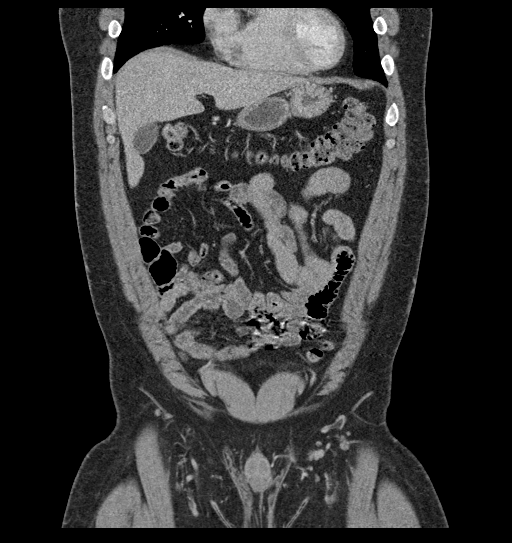
[im 73/164  soft-tissue]
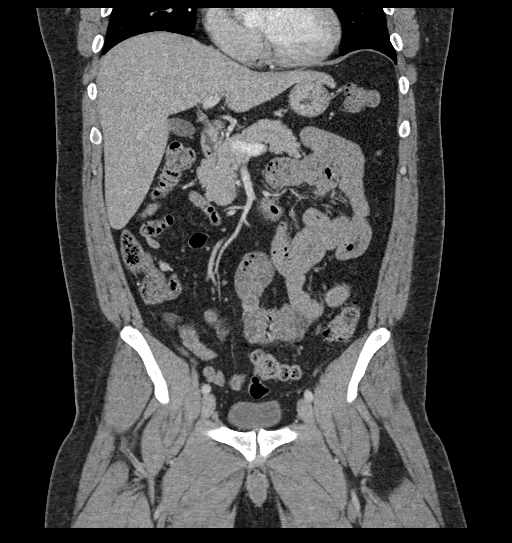
[im 91/164  soft-tissue]
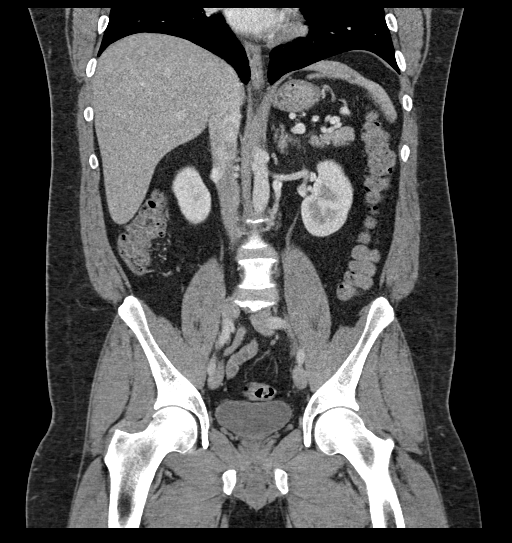

[15 of 46 positions shown; findings below may reference images not displayed]

RADIATION DOSE REDUCTION: This exam was performed according to the
departmental dose-optimization program which includes automated
exposure control, adjustment of the mA and/or kV according to
patient size and/or use of iterative reconstruction technique.

CONTRAST:  100mL OMNIPAQUE IOHEXOL 300 MG/ML  SOLN
FINDINGS: Lower chest: No acute abnormality.

Hepatobiliary: No suspicious hepatic lesion. Gallbladder is
unremarkable. No biliary ductal dilation.

Pancreas: No pancreatic ductal dilation or evidence of acute
inflammation.

Spleen: No splenomegaly or suspicious splenic lesion.

Adrenals/Urinary Tract: Bilateral adrenal glands appear normal. No
hydronephrosis. Symmetric enhancement excretion of contrast from the
bilateral kidneys. No solid enhancing renal mass. Urinary bladder is
unremarkable for degree of distension.

Stomach/Bowel: No enteric contrast was administered. Stomach is
minimally distended without wall thickening. Pathologic dilation of
small or large bowel. The terminal ileum and terminal ileum appear
normal. No evidence of acute bowel inflammation. No suspicious
colonic wall thickening or mass like lesions identified.

Vascular/Lymphatic: Scattered aortic atherosclerosis without
abdominal aortic aneurysm. No pathologically enlarged abdominopelvic
lymph nodes.

Reproductive: Prostate is unremarkable.

Other: No significant abdominopelvic free fluid. No
pneumoperitoneum.

Musculoskeletal: No acute or significant osseous findings.
IMPRESSION: 1. No acute abnormality in the abdomen or pelvis. Specifically, no
evidence of acute appendicitis.
2. Aortic Atherosclerosis ([id number].X).

## 2023-11-26 ENCOUNTER — Ambulatory Visit (INDEPENDENT_AMBULATORY_CARE_PROVIDER_SITE_OTHER): Payer: BC Managed Care – PPO | Admitting: Family Medicine

## 2023-11-26 ENCOUNTER — Encounter: Payer: Self-pay | Admitting: Family Medicine

## 2023-11-26 VITALS — BP 123/82 | HR 66 | Ht 69.0 in | Wt 252.0 lb

## 2023-11-26 DIAGNOSIS — Z130 Encounter for screening for diseases of the blood and blood-forming organs and certain disorders involving the immune mechanism: Secondary | ICD-10-CM

## 2023-11-26 DIAGNOSIS — Z13220 Encounter for screening for lipoid disorders: Secondary | ICD-10-CM | POA: Diagnosis not present

## 2023-11-26 DIAGNOSIS — Z0000 Encounter for general adult medical examination without abnormal findings: Secondary | ICD-10-CM | POA: Diagnosis not present

## 2023-11-26 DIAGNOSIS — Z1211 Encounter for screening for malignant neoplasm of colon: Secondary | ICD-10-CM

## 2023-11-26 DIAGNOSIS — Z1329 Encounter for screening for other suspected endocrine disorder: Secondary | ICD-10-CM

## 2023-11-26 DIAGNOSIS — Z13228 Encounter for screening for other metabolic disorders: Secondary | ICD-10-CM

## 2023-11-26 MED ORDER — OMEPRAZOLE 40 MG PO CPDR: 40.0000 mg | DELAYED_RELEASE_CAPSULE | Freq: Every day | ORAL | 1 refills | Status: AC

## 2023-11-26 MED ORDER — CLOBETASOL PROPIONATE 0.05 % EX CREA: 1.0000 | TOPICAL_CREAM | Freq: Two times a day (BID) | CUTANEOUS | 1 refills | Status: AC

## 2023-11-26 NOTE — Progress Notes (Signed)
 Established Patient Office Visit  Subjective    Patient ID: Terrence Terry, male    DOB: 1980/12/01  Age: 43 y.o. MRN: [id number]  CC:  Chief Complaint  Patient presents with   Annual Exam    Pt reports itchy ears and droopy eyes.    HPI Terrence Terry presents for routine annual exam. Patient denies acute complaints.   Outpatient Encounter Medications as of 11/26/2023  Medication Sig   clobetasol  cream (TEMOVATE ) 0.05 % Apply 1 Application topically 2 (two) times daily.   fluticasone  (FLONASE ) 50 MCG/ACT nasal spray Place 2 sprays into both nostrils daily. (Patient not taking: Reported on 11/26/2022)   omeprazole  (PRILOSEC) 40 MG capsule Take 1 capsule (40 mg total) by mouth daily.   [DISCONTINUED] omeprazole  (PRILOSEC) 40 MG capsule Take 1 capsule (40 mg total) by mouth daily.   No facility-administered encounter medications on file as of 11/26/2023.    Past Medical History:  Diagnosis Date   Anxiety    Blood pressure elevated without history of HTN    Environmental allergies    H/O alcohol abuse    PTSD (post-traumatic stress disorder)    Sleep apnea     Past Surgical History:  Procedure Laterality Date   ANKLE SURGERY Right    ELBOW SURGERY     FRACTURE SURGERY     leg with hardware placed    Family History  Problem Relation Age of Onset   Hypertension Mother    Bipolar disorder Mother    Depression Mother    Hyperlipidemia Mother    Arthritis Mother    Hearing loss Mother    Lung cancer Father    Alcoholism Father    Alcohol abuse Father    Arthritis Father    COPD Father    Stroke Brother    Drug abuse Brother    Asthma Sister    Intellectual disability Maternal Uncle    Intellectual disability Paternal Uncle     Social History   Socioeconomic History   Marital status: Married    Spouse name: Not on file   Number of children: Not on file   Years of education: Not on file   Highest education level: Associate degree: occupational, Scientist, product/process development, or  vocational program  Occupational History   Not on file  Tobacco Use   Smoking status: Every Day    Current packs/day: 0.25    Types: Cigarettes   Smokeless tobacco: Never  Substance and Sexual Activity   Alcohol use: Yes    Alcohol/week: 12.0 standard drinks of alcohol    Types: 12 Cans of beer per week   Drug use: No   Sexual activity: Yes    Birth control/protection: None  Other Topics Concern   Not on file  Social History Narrative   Not on file   Social Drivers of Health   Financial Resource Strain: Low Risk  (11/25/2023)   Overall Financial Resource Strain (CARDIA)    Difficulty of Paying Living Expenses: Not hard at all  Food Insecurity: No Food Insecurity (11/25/2023)   Hunger Vital Sign    Worried About Running Out of Food in the Last Year: Never true    Ran Out of Food in the Last Year: Never true  Transportation Needs: No Transportation Needs (11/25/2023)   PRAPARE - Administrator, Civil Service (Medical): No    Lack of Transportation (Non-Medical): No  Physical Activity: Sufficiently Active (11/25/2023)   Exercise Vital Sign    Days of  Exercise per Week: 5 days    Minutes of Exercise per Session: 60 min  Stress: Stress Concern Present (11/25/2023)   [employer] of Occupational Health - Occupational Stress Questionnaire    Feeling of Stress: To some extent  Social Connections: Socially Isolated (11/25/2023)   Social Connection and Isolation Panel    Frequency of Communication with Friends and Family: Never    Frequency of Social Gatherings with Friends and Family: Never    Attends Religious Services: Never    Database administrator or Organizations: No    Attends Engineer, structural: Not on file    Marital Status: Married  Catering manager Violence: Not At Risk (11/26/2022)   Humiliation, Afraid, Rape, and Kick questionnaire    Fear of Current or Ex-Partner: No    Emotionally Abused: No    Physically Abused: No    Sexually Abused: No     Review of Systems  All other systems reviewed and are negative.       Objective    BP 123/82   Pulse 66   Ht 5' 9 (1.753 m)   Wt 252 lb (114.3 kg)   SpO2 95%   BMI 37.21 kg/m   Physical Exam Vitals and nursing note reviewed.  Constitutional:      General: [name] not in acute distress. HENT:     Head: Normocephalic and atraumatic.     Right Ear: Tympanic membrane, ear canal and external ear normal.     Left Ear: Tympanic membrane, ear canal and external ear normal.     Nose: Nose normal.     Mouth/Throat:     Mouth: Mucous membranes are moist.     Pharynx: Oropharynx is clear.  Eyes:     Conjunctiva/sclera: Conjunctivae normal.     Pupils: Pupils are equal, round, and reactive to light.  Neck:     Thyroid: No thyromegaly.  Cardiovascular:     Rate and Rhythm: Normal rate and regular rhythm.     Heart sounds: Normal heart sounds. No murmur heard. Pulmonary:     Effort: Pulmonary effort is normal.     Breath sounds: Normal breath sounds.  Abdominal:     General: There is no distension.     Palpations: Abdomen is soft. There is no mass.     Tenderness: There is no abdominal tenderness.     Hernia: There is no hernia in the left inguinal area or right inguinal area.  Musculoskeletal:        General: Normal range of motion.     Cervical back: Normal range of motion and neck supple.     Right lower leg: No edema.     Left lower leg: No edema.  Skin:    General: Skin is warm and dry.     Findings: Rash (thick scaly rash noted on anterior left shin) present.  Neurological:     General: No focal deficit present.     Mental Status: [name] alert and oriented to person, place, and time. Mental status is at baseline.  Psychiatric:        Mood and Affect: Mood normal.        Behavior: Behavior normal.         Assessment & Plan:   Annual physical exam -     Comprehensive metabolic panel with GFR  Screening for deficiency anemia -     CBC with  Differential/Platelet  Screening for lipid disorders -  Lipid panel  Screening for endocrine/metabolic/immunity disorders -     Hemoglobin A1c  Screening for colon cancer -     Cologuard  Other orders -     Omeprazole ; Take 1 capsule (40 mg total) by mouth daily.  Dispense: 90 capsule; Refill: 1 -     Clobetasol  Propionate; Apply 1 Application topically 2 (two) times daily.  Dispense: 60 g; Refill: 1     No follow-ups on file.   Tanda Raguel SQUIBB, MD

## 2023-11-27 ENCOUNTER — Ambulatory Visit: Payer: Self-pay | Admitting: Family Medicine

## 2023-11-27 LAB — CBC WITH DIFFERENTIAL/PLATELET
Basophils Absolute: 0 x10E3/uL (ref 0.0–0.2)
Basos: 0 %
EOS (ABSOLUTE): 0.2 x10E3/uL (ref 0.0–0.4)
Eos: 3 %
Hematocrit: 47.8 % (ref 37.5–51.0)
Hemoglobin: 15.8 g/dL (ref 13.0–17.7)
Immature Grans (Abs): 0 x10E3/uL (ref 0.0–0.1)
Immature Granulocytes: 0 %
Lymphocytes Absolute: 1.9 x10E3/uL (ref 0.7–3.1)
Lymphs: 24 %
MCH: 31 pg (ref 26.6–33.0)
MCHC: 33.1 g/dL (ref 31.5–35.7)
MCV: 94 fL (ref 79–97)
Monocytes Absolute: 0.5 x10E3/uL (ref 0.1–0.9)
Monocytes: 6 %
Neutrophils Absolute: 5.4 x10E3/uL (ref 1.4–7.0)
Neutrophils: 67 %
Platelets: 280 x10E3/uL (ref 150–450)
RBC: 5.1 x10E6/uL (ref 4.14–5.80)
RDW: 12.6 % (ref 11.6–15.4)
WBC: 8.1 x10E3/uL (ref 3.4–10.8)

## 2023-11-27 LAB — COMPREHENSIVE METABOLIC PANEL WITH GFR
ALT: 39 IU/L (ref 0–44)
AST: 29 IU/L (ref 0–40)
Albumin: 4.5 g/dL (ref 4.1–5.1)
Alkaline Phosphatase: 124 IU/L — ABNORMAL HIGH (ref 44–121)
BUN/Creatinine Ratio: 18 (ref 9–20)
BUN: 15 mg/dL (ref 6–24)
Bilirubin Total: 0.8 mg/dL (ref 0.0–1.2)
CO2: 23 mmol/L (ref 20–29)
Calcium: 9.4 mg/dL (ref 8.7–10.2)
Chloride: 99 mmol/L (ref 96–106)
Creatinine, Ser: 0.85 mg/dL (ref 0.76–1.27)
Globulin, Total: 2.8 g/dL (ref 1.5–4.5)
Glucose: 98 mg/dL (ref 70–99)
Potassium: 4.7 mmol/L (ref 3.5–5.2)
Sodium: 138 mmol/L (ref 134–144)
Total Protein: 7.3 g/dL (ref 6.0–8.5)
eGFR: 110 mL/min/1.73 (ref >59)

## 2023-11-27 LAB — LIPID PANEL
Chol/HDL Ratio: 5.2 ratio — ABNORMAL HIGH (ref 0.0–5.0)
Cholesterol, Total: 224 mg/dL — ABNORMAL HIGH (ref 100–199)
HDL: 43 mg/dL (ref >39)
LDL Chol Calc (NIH): 133 mg/dL — ABNORMAL HIGH (ref 0–99)
Triglycerides: 267 mg/dL — ABNORMAL HIGH (ref 0–149)
VLDL Cholesterol Cal: 48 mg/dL — ABNORMAL HIGH (ref 5–40)

## 2023-11-27 LAB — HEMOGLOBIN A1C
Est. average glucose Bld gHb Est-mCnc: 163 mg/dL
Hgb A1c MFr Bld: 7.3 % — ABNORMAL HIGH (ref 4.8–5.6)

## 2023-12-01 ENCOUNTER — Ambulatory Visit
Admission: EM | Admit: 2023-12-01 | Discharge: 2023-12-01 | Disposition: A | Discharge status: Home / Self Care | Payer: Self-pay | Attending: Physician Assistant | Admitting: Physician Assistant

## 2023-12-01 ENCOUNTER — Ambulatory Visit (INDEPENDENT_AMBULATORY_CARE_PROVIDER_SITE_OTHER): Payer: Self-pay

## 2023-12-01 DIAGNOSIS — Z029 Encounter for administrative examinations, unspecified: Secondary | ICD-10-CM | POA: Insufficient documentation

## 2023-12-01 DIAGNOSIS — S069XAA Unspecified intracranial injury with loss of consciousness status unknown, initial encounter: Secondary | ICD-10-CM | POA: Insufficient documentation

## 2023-12-01 DIAGNOSIS — M7660 Achilles tendinitis, unspecified leg: Secondary | ICD-10-CM | POA: Insufficient documentation

## 2023-12-01 DIAGNOSIS — M21969 Unspecified acquired deformity of unspecified lower leg: Secondary | ICD-10-CM | POA: Insufficient documentation

## 2023-12-01 DIAGNOSIS — Z0110 Encounter for examination of ears and hearing without abnormal findings: Secondary | ICD-10-CM | POA: Insufficient documentation

## 2023-12-01 DIAGNOSIS — S61216A Laceration without foreign body of right little finger without damage to nail, initial encounter: Secondary | ICD-10-CM | POA: Diagnosis not present

## 2023-12-01 DIAGNOSIS — Z023 Encounter for examination for recruitment to armed forces: Secondary | ICD-10-CM | POA: Insufficient documentation

## 2023-12-01 DIAGNOSIS — H52209 Unspecified astigmatism, unspecified eye: Secondary | ICD-10-CM | POA: Insufficient documentation

## 2023-12-01 DIAGNOSIS — F4312 Post-traumatic stress disorder, chronic: Secondary | ICD-10-CM | POA: Insufficient documentation

## 2023-12-01 HISTORY — DX: Gastro-esophageal reflux disease without esophagitis: K21.9

## 2023-12-01 NOTE — ED Triage Notes (Signed)
 Patient reports getting finger smashed at work (5th finger on right hand). 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 It seems to be cut sand crushed at the end of it. Last Tdap: 2024.

## 2023-12-02 ENCOUNTER — Other Ambulatory Visit: Payer: Self-pay | Admitting: Medical Genetics

## 2023-12-03 ENCOUNTER — Ambulatory Visit (INDEPENDENT_AMBULATORY_CARE_PROVIDER_SITE_OTHER): Admitting: Family Medicine

## 2023-12-03 ENCOUNTER — Encounter: Payer: Self-pay | Admitting: Family Medicine

## 2023-12-03 VITALS — BP 129/87 | HR 70 | Ht 69.0 in | Wt 250.8 lb

## 2023-12-03 DIAGNOSIS — E119 Type 2 diabetes mellitus without complications: Secondary | ICD-10-CM | POA: Diagnosis not present

## 2023-12-03 DIAGNOSIS — R899 Unspecified abnormal finding in specimens from other organs, systems and tissues: Secondary | ICD-10-CM

## 2023-12-03 DIAGNOSIS — F17210 Nicotine dependence, cigarettes, uncomplicated: Secondary | ICD-10-CM

## 2023-12-03 DIAGNOSIS — Z7984 Long term (current) use of oral hypoglycemic drugs: Secondary | ICD-10-CM

## 2023-12-03 DIAGNOSIS — E785 Hyperlipidemia, unspecified: Secondary | ICD-10-CM

## 2023-12-03 MED ORDER — METFORMIN HCL ER 750 MG PO TB24: 750.0000 mg | ORAL_TABLET | Freq: Every day | ORAL | 1 refills | Status: AC

## 2023-12-04 NOTE — Progress Notes (Signed)
 Established Patient Office Visit  Subjective    Patient ID: Terrence Terry, male    DOB: 09-16-1980  Age: 43 y.o. MRN: [id number]  CC:  Chief Complaint  Patient presents with   discuss lab results     HPI Terrence Terry presents for follow up of abnormal labs that showed elevated A1c and cholesterol levels.   Outpatient Encounter Medications as of 12/03/2023  Medication Sig   clobetasol  cream (TEMOVATE ) 0.05 % Apply 1 Application topically 2 (two) times daily.   metFORMIN  (GLUCOPHAGE -XR) 750 MG 24 hr tablet Take 1 tablet (750 mg total) by mouth daily with breakfast.   omeprazole  (PRILOSEC) 40 MG capsule Take 1 capsule (40 mg total) by mouth daily.   fluticasone  (FLONASE ) 50 MCG/ACT nasal spray Place 2 sprays into both nostrils daily. (Patient not taking: Reported on 12/03/2023)   No facility-administered encounter medications on file as of 12/03/2023.    Past Medical History:  Diagnosis Date   Anxiety    Ptsd   Blood pressure elevated without history of HTN    Environmental allergies    GERD (gastroesophageal reflux disease)    H/O alcohol abuse    PTSD (post-traumatic stress disorder)    Sleep apnea     Past Surgical History:  Procedure Laterality Date   ANKLE SURGERY Right    ELBOW SURGERY     FRACTURE SURGERY     leg with hardware placed    Family History  Problem Relation Age of Onset   Hypertension Mother    Bipolar disorder Mother    Depression Mother    Hyperlipidemia Mother    Arthritis Mother    Hearing loss Mother    Lung cancer Father    Alcoholism Father    Alcohol abuse Father    Arthritis Father    COPD Father    Stroke Brother    Drug abuse Brother    Asthma Sister    Intellectual disability Maternal Uncle    Intellectual disability Paternal Uncle     Social History   Socioeconomic History   Marital status: Married    Spouse name: Not on file   Number of children: Not on file   Years of education: Not on file   Highest education level:  Associate degree: occupational, Scientist, product/process development, or vocational program  Occupational History   Not on file  Tobacco Use   Smoking status: Every Day    Current packs/day: 0.25    Types: Cigarettes   Smokeless tobacco: Never  Vaping Use   Vaping status: Never Used  Substance and Sexual Activity   Alcohol use: Yes    Alcohol/week: 12.0 standard drinks of alcohol    Types: 12 Cans of beer per week   Drug use: No   Sexual activity: Yes    Birth control/protection: None  Other Topics Concern   Not on file  Social History Narrative   Not on file   Social Drivers of Health   Financial Resource Strain: Low Risk  (11/25/2023)   Overall Financial Resource Strain (CARDIA)    Difficulty of Paying Living Expenses: Not hard at all  Food Insecurity: No Food Insecurity (11/25/2023)   Hunger Vital Sign    Worried About Running Out of Food in the Last Year: Never true    Ran Out of Food in the Last Year: Never true  Transportation Needs: No Transportation Needs (11/25/2023)   PRAPARE - Transportation    Lack of Transportation (Medical): No    Lack of  Transportation (Non-Medical): No  Physical Activity: Sufficiently Active (11/25/2023)   Exercise Vital Sign    Days of Exercise per Week: 5 days    Minutes of Exercise per Session: 60 min  Stress: Stress Concern Present (11/25/2023)   [employer] of Occupational Health - Occupational Stress Questionnaire    Feeling of Stress: To some extent  Social Connections: Socially Isolated (11/25/2023)   Social Connection and Isolation Panel    Frequency of Communication with Friends and Family: Never    Frequency of Social Gatherings with Friends and Family: Never    Attends Religious Services: Never    Database administrator or Organizations: No    Attends Engineer, structural: Not on file    Marital Status: Married  Catering manager Violence: Not At Risk (11/26/2022)   Humiliation, Afraid, Rape, and Kick questionnaire    Fear of Current or  Ex-Partner: No    Emotionally Abused: No    Physically Abused: No    Sexually Abused: No    Review of Systems  All other systems reviewed and are negative.       Objective    BP 129/87   Pulse 70   Ht 5' 9 (1.753 m)   Wt 250 lb 12.8 oz (113.8 kg)   SpO2 97%   BMI 37.04 kg/m   Physical Exam Vitals and nursing note reviewed.  Constitutional:      General: [name] not in acute distress. Cardiovascular:     Rate and Rhythm: Normal rate and regular rhythm.  Pulmonary:     Effort: Pulmonary effort is normal.     Breath sounds: Normal breath sounds.  Abdominal:     Palpations: Abdomen is soft.     Tenderness: There is no abdominal tenderness.  Neurological:     General: No focal deficit present.     Mental Status: [name] alert and oriented to person, place, and time.         Assessment & Plan:   1. Abnormal laboratory test (Primary)   2. Type 2 diabetes mellitus without complication, without long-term current use of insulin (HCC) Metformin  prescribed.   3. Hyperlipidemia, unspecified hyperlipidemia type Patient opts for dietary and activity changes and monitor.   Return in about 3 months (around 03/03/2024) for follow up.   Tanda Raguel SQUIBB, MD

## 2023-12-08 DIAGNOSIS — S61216A Laceration without foreign body of right little finger without damage to nail, initial encounter: Secondary | ICD-10-CM

## 2023-12-08 NOTE — ED Provider Notes (Signed)
 [facility]    CSN: [id number] Arrival date & time: 12/01/23  1439      History   Chief Complaint Chief Complaint  Patient presents with   Laceration    HPI Terrence Terry is a 43 y.o. male.   Patient here today for evaluation of injury to his right 5th finger that occurred at work today when he had his finger smashed by a steel pillar. [name] up to date on tetanus vaccination. He denies any numbness.   The history is provided by the patient.  Laceration Associated symptoms: no fever     Past Medical History:  Diagnosis Date   Anxiety    Ptsd   Blood pressure elevated without history of HTN    Environmental allergies    GERD (gastroesophageal reflux disease)    H/O alcohol abuse    PTSD (post-traumatic stress disorder)    Sleep apnea     Patient Active Problem List   Diagnosis Date Noted   Traumatic brain injury (HCC) 12/01/2023   Achilles tendinitis 12/01/2023   Chronic post-traumatic stress disorder (PTSD) 12/01/2023   Encounter for examination for recruitment to armed forces 12/01/2023   Acquired ankle deformity 12/01/2023   Examination of ears and hearing 12/01/2023   Encounters for administrative purposes 12/01/2023   Astigmatism 12/01/2023   Chronic post-traumatic stress disorder 11/24/2021   Alcohol abuse 11/24/2021   Alcohol dependence, continuous (HCC) 11/24/2021   Myopia 11/24/2021   Deformity of ankle and foot, acquired 11/24/2021   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   Health examination of defined subpopulation 11/24/2021   Hypersomnia with sleep apnea 11/24/2021   Condition influencing health status 11/24/2021   Overweight 11/24/2021   Personal history of traumatic brain injury 11/24/2021   Achilles bursitis or tendinitis 11/24/2021   Tobacco use disorder 11/24/2021   Sleep apnea 05/02/2018   Allergy to environmental factors 11/25/2017   Cough 11/25/2017    Past Surgical History:  Procedure  Laterality Date   ANKLE SURGERY Right    ELBOW SURGERY     FRACTURE SURGERY     leg with hardware placed       Home Medications    Prior to Admission medications   Medication Sig Start Date End Date Taking? Authorizing Provider  omeprazole  (PRILOSEC) 40 MG capsule Take 1 capsule (40 mg total) by mouth daily. 11/26/23  Yes Tanda Bleacher, MD  clobetasol  cream (TEMOVATE ) 0.05 % Apply 1 Application topically 2 (two) times daily. 11/26/23   Tanda Bleacher, MD  fluticasone  (FLONASE ) 50 MCG/ACT nasal spray Place 2 sprays into both nostrils daily. Patient not taking: Reported on 12/03/2023 07/16/19   Babara Greig GAILS, PA-C  metFORMIN  (GLUCOPHAGE -XR) 750 MG 24 hr tablet Take 1 tablet (750 mg total) by mouth daily with breakfast. 12/03/23   Tanda Bleacher, MD    Family History Family History  Problem Relation Age of Onset   Hypertension Mother    Bipolar disorder Mother    Depression Mother    Hyperlipidemia Mother    Arthritis Mother    Hearing loss Mother    Lung cancer Father    Alcoholism Father    Alcohol abuse Father    Arthritis Father    COPD Father    Stroke Brother    Drug abuse Brother    Asthma Sister    Intellectual disability Maternal Uncle    Intellectual disability Paternal Uncle     Social History Social History   Tobacco Use  Smoking status: Every Day    Current packs/day: 0.25    Types: Cigarettes   Smokeless tobacco: Never  Vaping Use   Vaping status: Never Used  Substance Use Topics   Alcohol use: Yes    Alcohol/week: 12.0 standard drinks of alcohol    Types: 12 Cans of beer per week   Drug use: No     Allergies   Bee pollen   Review of Systems Review of Systems  Constitutional:  Negative for chills and fever.  Eyes:  Negative for discharge and redness.  Respiratory:  Negative for shortness of breath.   Skin:  Positive for wound.  Neurological:  Negative for numbness.     Physical Exam Triage Vital Signs ED Triage Vitals  Encounter Vitals  Group     BP 12/01/23 1452 114/79     Girls Systolic BP Percentile --      Girls Diastolic BP Percentile --      Boys Systolic BP Percentile --      Boys Diastolic BP Percentile --      Pulse Rate 12/01/23 1452 92     Resp 12/01/23 1452 18     Temp 12/01/23 1452 98.3 F (36.8 C)     Temp Source 12/01/23 1452 Oral     SpO2 12/01/23 1452 96 %     Weight 12/01/23 1451 251 lb 15.8 oz (114.3 kg)     Height 12/01/23 1451 5' 9 (1.753 m)     Head Circumference --      Peak Flow --      Pain Score 12/01/23 1447 2     Pain Loc --      Pain Education --      Exclude from Growth Chart --    No data found.  Updated Vital Signs BP 114/79 (BP Location: Left Arm)   Pulse 92   Temp 98.3 F (36.8 C) (Oral)   Resp 18   Ht 5' 9 (1.753 m)   Wt 251 lb 15.8 oz (114.3 kg)   SpO2 96%   BMI 37.21 kg/m   Visual Acuity Right Eye Distance:   Left Eye Distance:   Bilateral Distance:    Right Eye Near:   Left Eye Near:    Bilateral Near:     Physical Exam Vitals and nursing note reviewed.  Constitutional:      General: [name] not in acute distress.    Appearance: Normal appearance. [name] not ill-appearing.  HENT:     Head: Normocephalic and atraumatic.  Eyes:     Conjunctiva/sclera: Conjunctivae normal.  Cardiovascular:     Rate and Rhythm: Normal rate.  Pulmonary:     Effort: Pulmonary effort is normal. No respiratory distress.  Musculoskeletal:     Comments: Full ROM of right 5th finger, diffuse swelling distally, approx 2 cm laceration to palmar surface between PIP, DIP.- no tendon visualize, mild bleeding  Skin:    Capillary Refill: Normal cap refill to right 5th finger Neurological:     Mental Status: [name] alert.     Comments: Gross sensation intact to distal right 5th finger  Psychiatric:        Mood and Affect: Mood normal.        Behavior: Behavior normal.        Thought Content: Thought content normal.      UC Treatments / Results  Labs (all labs ordered are  listed, but only abnormal results are displayed) Labs Reviewed - No  data to display  EKG   Radiology No results found.  Procedures Laceration Repair  Date/Time: 12/08/2023 12:54 PM  Performed by: Billy Asberry FALCON, PA-C Authorized by: Billy Asberry FALCON, PA-C   Consent:    Consent obtained:  Verbal   Consent given by:  Patient   Risks discussed:  Infection, need for additional repair, pain, poor cosmetic result and poor wound healing   Alternatives discussed:  No treatment and delayed treatment Universal protocol:    Procedure explained and questions answered to patient or proxy's satisfaction: yes     Imaging studies available: yes     Patient identity confirmed:  Verbally with patient Anesthesia:    Anesthesia method:  Local infiltration   Local anesthetic:  Lidocaine 1% WITH epi (1cc) Laceration details:    Location:  Finger   Finger location:  R small finger   Length (cm):  2 Pre-procedure details:    Preparation:  Imaging obtained to evaluate for foreign bodies and patient was prepped and draped in usual sterile fashion Exploration:    Contaminated: no   Treatment:    Area cleansed with:  Povidone-iodine   Amount of cleaning:  Standard Skin repair:    Repair method:  Sutures   Suture size:  3-0   Suture material:  Prolene   Suture technique:  Simple interrupted   Number of sutures:  3 Approximation:    Approximation:  Close Repair type:    Repair type:  Simple Post-procedure details:    Dressing:  Non-adherent dressing and splint for protection   Procedure completion:  Tolerated well, no immediate complications  (including critical care time)  Medications Ordered in UC Medications - No data to display  Initial Impression / Assessment and Plan / UC Course  I have reviewed the triage vital signs and the nursing notes.  Pertinent labs & imaging results that were available during my care of the patient were reviewed by me and considered in my medical decision  making (see chart for details).    Xray without fracture or FB. Laceration repaired without complication. Patient will return in one week for suture removal or sooner with any signs of infection or further concerns.   Final Clinical Impressions(s) / UC Diagnoses   Final diagnoses:  Laceration of right little finger without foreign body without damage to nail, initial encounter   Discharge Instructions   None    ED Prescriptions   None    PDMP not reviewed this encounter.   Billy Asberry FALCON, PA-C 12/08/23 1257

## 2023-12-10 LAB — COLOGUARD: COLOGUARD: NEGATIVE

## 2023-12-24 ENCOUNTER — Other Ambulatory Visit

## 2023-12-24 DIAGNOSIS — Z006 Encounter for examination for normal comparison and control in clinical research program: Secondary | ICD-10-CM

## 2024-01-04 LAB — GENECONNECT MOLECULAR SCREEN: Genetic Analysis Overall Interpretation: NEGATIVE

## 2024-03-04 ENCOUNTER — Ambulatory Visit: Payer: Self-pay | Admitting: Family Medicine

## 2024-03-04 ENCOUNTER — Ambulatory Visit: Admitting: Family Medicine

## 2024-03-04 ENCOUNTER — Encounter: Payer: Self-pay | Admitting: Family Medicine

## 2024-03-04 VITALS — BP 124/83 | HR 71 | Ht 69.0 in | Wt 229.8 lb

## 2024-03-04 DIAGNOSIS — E119 Type 2 diabetes mellitus without complications: Secondary | ICD-10-CM

## 2024-03-04 DIAGNOSIS — E785 Hyperlipidemia, unspecified: Secondary | ICD-10-CM

## 2024-03-04 LAB — POCT GLYCOSYLATED HEMOGLOBIN (HGB A1C): HbA1c, POC (controlled diabetic range): 5.1 % (ref 0.0–7.0)

## 2024-03-04 NOTE — Progress Notes (Unsigned)
 Established Patient Office Visit  Subjective    Patient ID: Terrence Terry, male    DOB: [birth year]  Age: 43 y.o. MRN: [id number]  CC:  Chief Complaint  Patient presents with   Medical Management of Chronic Issues    HPI Terrence Terry presents for routine follow up of diabetes and hyperlipidemia. Patient reports med compliance and denies acute complaints.   Outpatient Encounter Medications as of 03/04/2024  Medication Sig   metFORMIN  (GLUCOPHAGE -XR) 750 MG 24 hr tablet Take 1 tablet (750 mg total) by mouth daily with breakfast.   omeprazole  (PRILOSEC) 40 MG capsule Take 1 capsule (40 mg total) by mouth daily.   clobetasol  cream (TEMOVATE ) 0.05 % Apply 1 Application topically 2 (two) times daily.   fluticasone  (FLONASE ) 50 MCG/ACT nasal spray Place 2 sprays into both nostrils daily. (Patient not taking: Reported on 12/03/2023)   No facility-administered encounter medications on file as of 03/04/2024.    Past Medical History:  Diagnosis Date   Anxiety    Ptsd   Blood pressure elevated without history of HTN    Environmental allergies    GERD (gastroesophageal reflux disease)    H/O alcohol abuse    PTSD (post-traumatic stress disorder)    Sleep apnea     Past Surgical History:  Procedure Laterality Date   ANKLE SURGERY Right    ELBOW SURGERY     FRACTURE SURGERY     leg with hardware placed    Family History  Problem Relation Age of Onset   Hypertension Mother    Bipolar disorder Mother    Depression Mother    Hyperlipidemia Mother    Arthritis Mother    Hearing loss Mother    Lung cancer Father    Alcoholism Father    Alcohol abuse Father    Arthritis Father    COPD Father    Stroke Brother    Drug abuse Brother    Asthma Sister    Intellectual disability Maternal Uncle    Intellectual disability Paternal Uncle     Social History   Socioeconomic History   Marital status: Married    Spouse name: Not on file   Number of children: Not on file   Years  of education: Not on file   Highest education level: Associate degree: occupational, scientist, product/process development, or vocational program  Occupational History   Not on file  Tobacco Use   Smoking status: Every Day    Current packs/day: 0.25    Types: Cigarettes   Smokeless tobacco: Never  Vaping Use   Vaping status: Never Used  Substance and Sexual Activity   Alcohol use: Yes    Alcohol/week: 12.0 standard drinks of alcohol    Types: 12 Cans of beer per week   Drug use: No   Sexual activity: Yes    Birth control/protection: None  Other Topics Concern   Not on file  Social History Narrative   Not on file   Social Drivers of Health   Tobacco Use: High Risk (03/04/2024)   Patient History    Smoking Tobacco Use: Every Day    Smokeless Tobacco Use: Never    Passive Exposure: Not on file  Financial Resource Strain: Low Risk (11/25/2023)   Overall Financial Resource Strain (CARDIA)    Difficulty of Paying Living Expenses: Not hard at all  Food Insecurity: No Food Insecurity (03/04/2024)   Epic    Worried About Radiation Protection Practitioner of Food in the Last Year: Never true    Ran  Out of Food in the Last Year: Never true  Transportation Needs: No Transportation Needs (03/04/2024)   Epic    Lack of Transportation (Medical): No    Lack of Transportation (Non-Medical): No  Physical Activity: Sufficiently Active (11/25/2023)   Exercise Vital Sign    Days of Exercise per Week: 5 days    Minutes of Exercise per Session: 60 min  Stress: Stress Concern Present (11/25/2023)   [employer] of Occupational [employer] - Occupational Stress Questionnaire    Feeling of Stress: To some extent  Social Connections: Socially Isolated (11/25/2023)   Social Connection and Isolation Panel    Frequency of Communication with Friends and Family: Never    Frequency of Social Gatherings with Friends and Family: Never    Attends Religious Services: Never    Database Administrator or Organizations: No    Attends Hospital Doctor: Not on file    Marital Status: Married  Catering Manager Violence: Not At Risk (03/04/2024)   Epic    Fear of Current or Ex-Partner: No    Emotionally Abused: No    Physically Abused: No    Sexually Abused: No  Depression (PHQ2-9): Low Risk (03/04/2024)   Depression (PHQ2-9)    PHQ-2 Score: 1  Alcohol Screen: Low Risk (11/25/2023)   Alcohol Screen    Last Alcohol Screening Score (AUDIT): 5  Housing: Low Risk (03/04/2024)   Epic    Unable to Pay for Housing in the Last Year: No    Number of Times Moved in the Last Year: 0    Homeless in the Last Year: No  Utilities: Not At Risk (03/04/2024)   Epic    Threatened with loss of utilities: No  Health Literacy: Adequate Health Literacy (03/04/2024)   B1300 Health Literacy    Frequency of need for help with medical instructions: Never    Review of Systems  All other systems reviewed and are negative.       Objective    BP 124/83   Pulse 71   Ht 5' 9 (1.753 m)   Wt 229 lb 12.8 oz (104.2 kg)   SpO2 96%   BMI 33.94 kg/m   Physical Exam Vitals and nursing note reviewed.  Constitutional:      General: [name] not in acute distress. Cardiovascular:     Rate and Rhythm: Normal rate and regular rhythm.  Pulmonary:     Effort: Pulmonary effort is normal.     Breath sounds: Normal breath sounds.  Abdominal:     Palpations: Abdomen is soft.     Tenderness: There is no abdominal tenderness.  Neurological:     General: No focal deficit present.     Mental Status: [name] alert and oriented to person, place, and time.         Assessment & Plan:   Type 2 diabetes mellitus without complication, without long-term current use of insulin (HCC) -     POCT glycosylated hemoglobin (Hb A1C)  Hyperlipidemia, unspecified hyperlipidemia type     Return in about 4 months (around 07/03/2024).   Tanda Raguel SQUIBB, MD

## 2024-03-05 ENCOUNTER — Encounter: Payer: Self-pay | Admitting: Family Medicine

## 2024-03-10 ENCOUNTER — Other Ambulatory Visit: Payer: Self-pay | Admitting: Family Medicine

## 2024-07-08 ENCOUNTER — Ambulatory Visit: Payer: Self-pay | Admitting: Family Medicine
